# Patient Record
Sex: Male | Born: 1985 | Race: Black or African American | Hispanic: No | Marital: Married | Smoking: Never smoker
Health system: Southern US, Community
[De-identification: ages and names within clinical notes are randomized; demographics above are authoritative.]

## PROBLEM LIST (undated history)

## (undated) DIAGNOSIS — T7840XA Allergy, unspecified, initial encounter: Secondary | ICD-10-CM

## (undated) HISTORY — DX: Allergy, unspecified, initial encounter: T78.40XA

## (undated) HISTORY — PX: TONSILLECTOMY: SUR1361

---
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
## 2012-06-14 ENCOUNTER — Emergency Department (HOSPITAL_COMMUNITY): Payer: BC Managed Care – PPO

## 2012-06-14 ENCOUNTER — Emergency Department (HOSPITAL_COMMUNITY)
Admission: EM | Admit: 2012-06-14 | Discharge: 2012-06-15 | Disposition: A | Discharge status: Home / Self Care | Payer: BC Managed Care – PPO | Attending: Emergency Medicine | Admitting: Emergency Medicine

## 2012-06-14 ENCOUNTER — Encounter (HOSPITAL_COMMUNITY): Payer: Self-pay | Admitting: Emergency Medicine

## 2012-06-14 DIAGNOSIS — R000 Tachycardia, unspecified: Secondary | ICD-10-CM | POA: Insufficient documentation

## 2012-06-14 DIAGNOSIS — J029 Acute pharyngitis, unspecified: Secondary | ICD-10-CM | POA: Insufficient documentation

## 2012-06-14 DIAGNOSIS — R112 Nausea with vomiting, unspecified: Secondary | ICD-10-CM | POA: Insufficient documentation

## 2012-06-14 DIAGNOSIS — R509 Fever, unspecified: Secondary | ICD-10-CM | POA: Insufficient documentation

## 2012-06-14 DIAGNOSIS — R55 Syncope and collapse: Secondary | ICD-10-CM | POA: Insufficient documentation

## 2012-06-14 DIAGNOSIS — B9789 Other viral agents as the cause of diseases classified elsewhere: Secondary | ICD-10-CM | POA: Insufficient documentation

## 2012-06-14 DIAGNOSIS — R5381 Other malaise: Secondary | ICD-10-CM | POA: Insufficient documentation

## 2012-06-14 DIAGNOSIS — R51 Headache: Secondary | ICD-10-CM | POA: Insufficient documentation

## 2012-06-14 DIAGNOSIS — B349 Viral infection, unspecified: Secondary | ICD-10-CM

## 2012-06-14 LAB — COMPREHENSIVE METABOLIC PANEL
ALT: 25 U/L (ref 0–53)
AST: 20 U/L (ref 0–37)
Albumin: 4.1 g/dL (ref 3.5–5.2)
Alkaline Phosphatase: 57 U/L (ref 39–117)
Chloride: 101 mEq/L (ref 96–112)
Potassium: 3.5 mEq/L (ref 3.5–5.1)
Sodium: 137 mEq/L (ref 135–145)
Total Bilirubin: 1.7 mg/dL — ABNORMAL HIGH (ref 0.3–1.2)
Total Protein: 7.5 g/dL (ref 6.0–8.3)

## 2012-06-14 LAB — DIFFERENTIAL
Basophils Absolute: 0 10*3/uL (ref 0.0–0.1)
Basophils Relative: 0 % (ref 0–1)
Monocytes Relative: 5 % (ref 3–12)
Neutro Abs: 19.2 10*3/uL — ABNORMAL HIGH (ref 1.7–7.7)
Neutrophils Relative %: 89 % — ABNORMAL HIGH (ref 43–77)

## 2012-06-14 LAB — CBC
Hemoglobin: 13.4 g/dL (ref 13.0–17.0)
MCHC: 34.4 g/dL (ref 30.0–36.0)
Platelets: 228 10*3/uL (ref 150–400)
RDW: 12.8 % (ref 11.5–15.5)

## 2012-06-14 MED ORDER — KETOROLAC TROMETHAMINE 30 MG/ML IJ SOLN
30.0000 mg | Freq: Once | INTRAMUSCULAR | Status: AC
  Administered 2012-06-14: 30 mg via INTRAVENOUS
  Filled 2012-06-14: qty 1

## 2012-06-14 MED ORDER — SODIUM CHLORIDE 0.9 % IV SOLN
INTRAVENOUS | Status: DC
  Administered 2012-06-14: 23:00:00 via INTRAVENOUS

## 2012-06-14 MED ORDER — SODIUM CHLORIDE 0.9 % IV BOLUS (SEPSIS)
1000.0000 mL | Freq: Once | INTRAVENOUS | Status: AC
  Administered 2012-06-14: 1000 mL via INTRAVENOUS

## 2012-06-14 MED ORDER — ONDANSETRON HCL 4 MG/2ML IJ SOLN
4.0000 mg | Freq: Once | INTRAMUSCULAR | Status: AC
  Administered 2012-06-14: 4 mg via INTRAVENOUS
  Filled 2012-06-14: qty 2

## 2012-06-14 MED ORDER — HYDROMORPHONE HCL PF 1 MG/ML IJ SOLN
1.0000 mg | Freq: Once | INTRAMUSCULAR | Status: AC
  Administered 2012-06-14: 1 mg via INTRAVENOUS
  Filled 2012-06-14: qty 1

## 2012-06-14 NOTE — ED Notes (Signed)
Patient complaining of vomiting, chills, and headache after eating lunch today.

## 2012-06-14 NOTE — ED Notes (Signed)
Remains resting in bed on back. Pain 7\10. Nauseated. Denies needs. No distress. Family with patient. Awaiting to be seen. Call bell within reach.

## 2012-06-14 NOTE — ED Provider Notes (Signed)
History   This chart was scribed for William Jakes, MD by Shari Heritage. The patient was seen in room APA05/APA05. Patient's care was started at 1905.     CSN: [id number]  Arrival date & time 06/14/12  5621   First MD Initiated Contact with Patient 06/14/12 2108      Chief Complaint  Patient presents with  . Emesis  . Headache    (Consider location/radiation/quality/duration/timing/severity/associated sxs/prior treatment) Patient is a 26 y.o. male presenting with headaches. The history is provided by the patient and a relative. No language interpreter was used.  Headache  This is a new problem. The current episode started 6 to 12 hours ago. The problem occurs constantly. The problem has been gradually improving. The headache is associated with an unknown factor. The pain is moderate. The pain does not radiate. Associated symptoms include a fever, near-syncope, nausea and vomiting. Pertinent negatives include no shortness of breath. He has tried nothing for the symptoms.   William Branch is a 26 y.o. male who presents to the Emergency Department complaining of a constant, diffuse headache onset 8 hours ago with associated vomiting and chills. Patient said that the vomiting started 1.5 hours after the headache. Patient says his headache started to improve when he arrived in the ED. Patient has also experienced fever, chills, back pain, syncopal episodes, and sore throat. Patient hasn't taken any medications for relief of symptoms. Patient says that his daughter is sick with similar symptoms. Patient denies body aches, chest pain, dysuria, rash, SOB, abdominal pain, diarrhea, and neck pain. Patient with surgical h/o tonsillectomy. Patient has never smoked. Patient is allergic to penicillins.  PCP - Hansel Feinstein  History reviewed. No pertinent past medical history.  Past Surgical History  Procedure Date  . Tonsillectomy     History reviewed. No pertinent family history.  History    Substance Use Topics  . Smoking status: Never Smoker   . Smokeless tobacco: Not on file  . Alcohol Use: Yes     ocassionally      Review of Systems  Constitutional: Positive for fever and chills.  HENT: Positive for sore throat. Negative for congestion and neck pain.   Respiratory: Negative for shortness of breath.   Cardiovascular: Positive for near-syncope. Negative for chest pain.  Gastrointestinal: Positive for nausea and vomiting. Negative for abdominal pain.  Musculoskeletal: Negative for back pain.  Skin: Negative for rash.  Neurological: Positive for syncope and headaches.   Patient is positive for fever, chills, back pain, syncope, and sore throat. Patient is negative for congestion, body aches, chest pain, dysuria, rash, SOB, abdominal pain, diarrhea, and neck pain.  Allergies  Penicillins  Home Medications   Current Outpatient Rx  Name Route Sig Dispense Refill  . DM-GUAIFENESIN ER 30-600 MG PO TB12 Oral Take 1 tablet by mouth every 12 (twelve) hours. 14 tablet 1  . NAPROXEN 500 MG PO TABS Oral Take 1 tablet (500 mg total) by mouth 2 (two) times daily. 14 tablet 0  . ONDANSETRON 8 MG PO TBDP Oral Take 1 tablet (8 mg total) by mouth every 8 (eight) hours as needed for nausea. 10 tablet 0  . PROMETHAZINE HCL 25 MG PO TABS Oral Take 1 tablet (25 mg total) by mouth every 6 (six) hours as needed for nausea. 12 tablet 0    BP 98/59  Pulse 112  Temp 100.2 F (37.9 C) (Oral)  Resp 18  Ht 5\' 10"  (1.778 m)  Wt 225 lb (102.059 kg)  BMI 32.28 kg/m2  SpO2 99%  Physical Exam  Nursing note and vitals reviewed. Constitutional: He is oriented to person, place, and time. He appears well-developed and well-nourished.  HENT:  Head: Normocephalic and atraumatic.  Eyes: Conjunctivae and EOM are normal. Pupils are equal, round, and reactive to light.  Neck: Normal range of motion. Neck supple.  Cardiovascular: Regular rhythm.  Tachycardia present.   No murmur  heard. Pulmonary/Chest: Effort normal and breath sounds normal. No respiratory distress. He has no wheezes. He has no rales.  Abdominal: Soft. Bowel sounds are normal. There is no tenderness.  Musculoskeletal: Normal range of motion.       No swelling in legs.  Lymphadenopathy:    He has no cervical adenopathy.  Neurological: He is alert and oriented to person, place, and time.  Skin: Skin is warm and dry.  Psychiatric: He has a normal mood and affect.    ED Course  Procedures (including critical care time) DIAGNOSTIC STUDIES: Oxygen Saturation is 100% on room air, normal by my interpretation.    COORDINATION OF CARE: 9:25PM- Patient informed of current plan for treatment and evaluation and agrees with plan at this time. Will administer IV fluids, Zofran and Dilaudid. Will order chest X-ray and head CT.  Results for orders placed during the hospital encounter of 06/14/12  CBC      Component Value Range   WBC 21.5 (*) 4.0 - 10.5 K/uL   RBC 4.85  4.22 - 5.81 MIL/uL   Hemoglobin 13.4  13.0 - 17.0 g/dL   HCT 46.9  62.9 - 52.8 %   MCV 80.4  78.0 - 100.0 fL   MCH 27.6  26.0 - 34.0 pg   MCHC 34.4  30.0 - 36.0 g/dL   RDW 41.3  24.4 - 01.0 %   Platelets 228  150 - 400 K/uL  DIFFERENTIAL      Component Value Range   Neutrophils Relative 89 (*) 43 - 77 %   Neutro Abs 19.2 (*) 1.7 - 7.7 K/uL   Lymphocytes Relative 6 (*) 12 - 46 %   Lymphs Abs 1.3  0.7 - 4.0 K/uL   Monocytes Relative 5  3 - 12 %   Monocytes Absolute 1.0  0.1 - 1.0 K/uL   Eosinophils Relative 0  0 - 5 %   Eosinophils Absolute 0.0  0.0 - 0.7 K/uL   Basophils Relative 0  0 - 1 %   Basophils Absolute 0.0  0.0 - 0.1 K/uL  COMPREHENSIVE METABOLIC PANEL      Component Value Range   Sodium 137  135 - 145 mEq/L   Potassium 3.5  3.5 - 5.1 mEq/L   Chloride 101  96 - 112 mEq/L   CO2 23  19 - 32 mEq/L   Glucose, Bld 116 (*) 70 - 99 mg/dL   BUN 15  6 - 23 mg/dL   Creatinine, Ser 2.72  0.50 - 1.35 mg/dL   Calcium 53.6  8.4 -  10.5 mg/dL   Total Protein 7.5  6.0 - 8.3 g/dL   Albumin 4.1  3.5 - 5.2 g/dL   AST 20  0 - 37 U/L   ALT 25  0 - 53 U/L   Alkaline Phosphatase 57  39 - 117 U/L   Total Bilirubin 1.7 (*) 0.3 - 1.2 mg/dL   GFR calc non Af Amer 80 (*) >90 mL/min   GFR calc Af Amer >90  >90 mL/min  RAPID STREP SCREEN  Component Value Range   Streptococcus, Group A Screen (Direct) NEGATIVE  NEGATIVE   Dg Chest 1 View  06/14/2012  *RADIOLOGY REPORT*  Clinical Data: Weakness, headache and emesis.  CHEST - 1 VIEW  Comparison: None.  Findings: The lungs are well-aerated.  There is elevation of the right hemidiaphragm.  There is no evidence of focal opacification, pleural effusion or pneumothorax.  The cardiomediastinal silhouette is within normal limits.  No acute osseous abnormalities are seen.  IMPRESSION: No acute cardiopulmonary process seen.  Original Report Authenticated By: Tonia Ghent, M.D.   Ct Head Wo Contrast  06/14/2012  *RADIOLOGY REPORT*  Clinical Data: Headache, fever and emesis; near-syncope.  Chills, back pain and sore throat.  CT HEAD WITHOUT CONTRAST  Technique:  Contiguous axial images were obtained from the base of the skull through the vertex without contrast.  Comparison: None.  Findings: There is no evidence of acute infarction, mass lesion, or intra- or extra-axial hemorrhage on CT.  The posterior fossa, including the cerebellum, brainstem and fourth ventricle, is within normal limits.  The third and lateral ventricles, and basal ganglia are unremarkable in appearance.  The cerebral hemispheres are symmetric in appearance, with normal gray- white differentiation.  No mass effect or midline shift is seen.  There is no evidence of fracture; visualized osseous structures are unremarkable in appearance.  The visualized portions of the orbits are within normal limits.  The paranasal sinuses and mastoid air cells are well-aerated.  No significant soft tissue abnormalities are seen.  IMPRESSION:  Unremarkable noncontrast CT of the head.  Original Report Authenticated By: Tonia Ghent, M.D.    1. Viral syndrome       MDM   Patient was on the onset of an illness at 2:00 in the afternoon which had a severe headache at first and then got vomiting bodyaches chills headache improved upon arrival here after 1 mg of Dilaudid it went away completely nausea and vomiting under control patient got 2 L of fluid before he urinated has absolutely no headache now no next deafness did have a sore throat with this as well no cough or congestion at this point in time. Strep test was negative chest x-ray is negative for pneumonia. Patient has been up and walking is not near syncopal or syncopal doubt that this is a bacterial meningitis suspected safe onset of a viral illness that may end up being in upper respiratory illness. Patient will need close followup is able to stay with his wife he has Dr. Kennis Carina for primary care Dr. And can return here for new worse symptoms. Family members noted bring him back for any worse headache her next deafness or fevers that go up to 104 or any new rash or anything is worse.  Marked leukocytosis is noted no significant electrolyte abnormalities slight liver function test abnormalities and not consistent with hepatitis. Again the patient has no neck stiffness has no low back stiffness.     I personally performed the services described in this documentation, which was scribed in my presence. The recorded information has been reviewed and considered.     William Jakes, MD 06/15/12 [phone]

## 2012-06-14 NOTE — ED Notes (Signed)
Remains resting in bed on back. Family with patient. Equal chest rise and fall, regular, unlabored. No distress. Denies needs. Call bell within reach. Will continue to monitor.

## 2012-06-14 NOTE — ED Notes (Signed)
Into room to assess patient. States around 1400, started having headache, chills, and nausea\ vomiting\ abdominal pain. Rates all pain 7\10. Headache all over forehead, throbbing and aching-- constant. Denies visual disturbances. Pupils 4mm bilaterally and reactive. Abdominal pain in center near umbilicus. Comes and goes "straining\ aching". Active bowel sounds. No tenderness-- abdomen soft. Nothing makes any of pain better or worse. Last food\ fluid at 1200. Tolerated. Last BM yesterday and normal. Denies needs. Family with patient. Awaiting MD eval.

## 2012-06-14 NOTE — ED Notes (Signed)
MD at bedside to evaluate.

## 2012-06-14 NOTE — ED Notes (Signed)
Pain 0\10. Denies nausea. Sats 88%. Notified to take deep breaths in through nose and out through mouth. Sats increased to 95% on room air. Placed on 2L O2 [place]. To radiology via stretcher.

## 2012-06-14 NOTE — ED Notes (Signed)
Medicated as ordered. Resting comfortably. Call bell and family at bedside. Denies needs. No distress. Equal chest rise and fall, regular, unlabored. Will continue to monitor.

## 2012-06-15 MED ORDER — PROMETHAZINE HCL 25 MG PO TABS: 25.0000 mg | ORAL_TABLET | Freq: Four times a day (QID) | ORAL | Status: DC | PRN

## 2012-06-15 MED ORDER — DM-GUAIFENESIN ER 30-600 MG PO TB12: 1.0000 | ORAL_TABLET | Freq: Two times a day (BID) | ORAL | Status: AC

## 2012-06-15 MED ORDER — NAPROXEN 500 MG PO TABS: 500.0000 mg | ORAL_TABLET | Freq: Two times a day (BID) | ORAL | Status: AC

## 2012-06-15 MED ORDER — ONDANSETRON 8 MG PO TBDP: 8.0000 mg | ORAL_TABLET | Freq: Three times a day (TID) | ORAL | Status: AC | PRN

## 2012-06-15 NOTE — ED Notes (Signed)
Assumed care for discharge only.  Discharge instructions given and reviewed with patient.  Prescriptions given for Naprosyn, Mucinex DM, Phenergan and Zofran; effects and use explained.  Patient verbalized understanding to take medications as directed and to follow up with PCP as needed.  Patient ambulatory with steady gait; discharged home in good condition.

## 2012-06-15 NOTE — Discharge Instructions (Signed)
Take medications as directedrest and work note provided return for any new or worse symptoms particularly high fever neck stiffness worse headache. If you start with coughing congestion taking Mucinex DM every 12 hours. Followup with your regular Dr. In the next 2 days if not better return here for any new worse symptoms. Increase fluids take antinausea medicine as directed.

## 2013-03-15 ENCOUNTER — Ambulatory Visit (INDEPENDENT_AMBULATORY_CARE_PROVIDER_SITE_OTHER): Payer: Federal, State, Local not specified - PPO | Admitting: Family Medicine

## 2013-03-15 ENCOUNTER — Encounter: Payer: Self-pay | Admitting: Family Medicine

## 2013-03-15 VITALS — BP 110/80 | Temp 98.4°F | Ht 69.0 in | Wt 204.0 lb

## 2013-03-15 DIAGNOSIS — R062 Wheezing: Secondary | ICD-10-CM

## 2013-03-15 MED ORDER — ALBUTEROL SULFATE (5 MG/ML) 0.5% IN NEBU
2.5000 mg | INHALATION_SOLUTION | Freq: Once | RESPIRATORY_TRACT | Status: AC
  Administered 2013-03-15: 2.5 mg via RESPIRATORY_TRACT

## 2013-03-15 MED ORDER — AZITHROMYCIN 250 MG PO TABS: ORAL_TABLET | ORAL | Status: AC

## 2013-03-15 MED ORDER — ALBUTEROL SULFATE HFA 108 (90 BASE) MCG/ACT IN AERS: 2.0000 | INHALATION_SPRAY | Freq: Four times a day (QID) | RESPIRATORY_TRACT | Status: AC | PRN

## 2013-03-15 MED ORDER — PREDNISONE 20 MG PO TABS: ORAL_TABLET | ORAL | Status: DC

## 2013-03-15 NOTE — Progress Notes (Signed)
  Subjective:    Patient ID: William Branch, male    DOB: 1986/03/19, 27 y.o.   MRN: [id number]  Wheezing  This is a new problem. The current episode started yesterday. The problem occurs constantly. The problem has been gradually worsening. Associated symptoms include coughing and shortness of breath. Pertinent negatives include no abdominal pain, chest pain, chills, diarrhea, ear pain, fever, headaches, rhinorrhea, sore throat, sputum production or swollen glands. Nothing aggravates the symptoms. He has tried nothing for the symptoms.   Should be noted that the patient had a mild cold over the past several days and he thinks this may have triggered this. He denies any sinus pressure he denies any high fevers.   Review of Systems  Constitutional: Negative for fever and chills.  HENT: Negative for ear pain, sore throat and rhinorrhea.   Respiratory: Positive for cough, shortness of breath and wheezing. Negative for sputum production.   Cardiovascular: Negative for chest pain.  Gastrointestinal: Negative for abdominal pain and diarrhea.  Neurological: Negative for headaches.       Objective:   Physical Exam  Constitutional: He appears well-developed.  HENT:  Head: Normocephalic.  Neck: Normal range of motion. No thyromegaly present.  Cardiovascular: Normal rate and regular rhythm.   Pulmonary/Chest: Effort normal. He has wheezes.  Patient not in respiratory distress but has significant difficulty getting a good deep breath he does have some wheezes and deep breathing does trigger coughing  Musculoskeletal: He exhibits no edema.  Lymphadenopathy:    He has no cervical adenopathy.          Assessment & Plan:  Wheezing - Plan: albuterol (PROVENTIL) (5 MG/ML) 0.5% nebulizer solution 2.5 mg  I believe this patient also has a mild URI/sinusitis Zithromax was prescribed also prednisone 20 mg tablets 3 tablets daily for 3 days, to a day for 3 days, 1 a day for 3 days. He will use  albuterol on a regular basis over the next few days then should gradually taper off he should do fine in the long run call us if reoccurring problems or followup. No orders of the defined types were placed in this encounter.

## 2013-03-15 NOTE — Patient Instructions (Signed)
Bronchospasm A bronchospasm is when the tubes that carry air in and out of your lungs (bronchioles) become smaller. It is hard to breathe when this happens. A bronchospasm can be caused by:  Asthma.  Allergies.  Lung infection. HOME CARE   Do not  smoke. Avoid places that have secondhand smoke.  Dust your house often. Have your air ducts cleaned once or twice a year.  Find out what allergies may cause your bronchospasms.  Use your inhaler properly if you have one. Know when to use it.  Eat healthy foods and drink plenty of water.  Only take medicine as told by your doctor. GET HELP RIGHT AWAY IF:  You feel you cannot breathe or catch your breath.  You cannot stop coughing.  Your treatment is not helping you breathe better. MAKE SURE YOU:   Understand these instructions.  Will watch your condition.  Will get help right away if you are not doing well or get worse. Document Released: 10/11/2009 Document Revised: 03/07/2012 Document Reviewed: 10/11/2009 [facility] Patient Information 2013 [place], [place].  Use your medications as discussed. If he feels things are getting worse over the next 2-3 days please call us. You ought to be much improved by this weekend. Call if any problems.

## 2013-10-02 ENCOUNTER — Encounter: Payer: Self-pay | Admitting: Family Medicine

## 2014-01-09 IMAGING — CR DG CHEST 1V
1 series · 1 of 1 positions shown · non-contrast
Comparison: None.

CLINICAL DATA: Weakness, headache and emesis.

CHEST - 1 VIEW

[view not recorded]
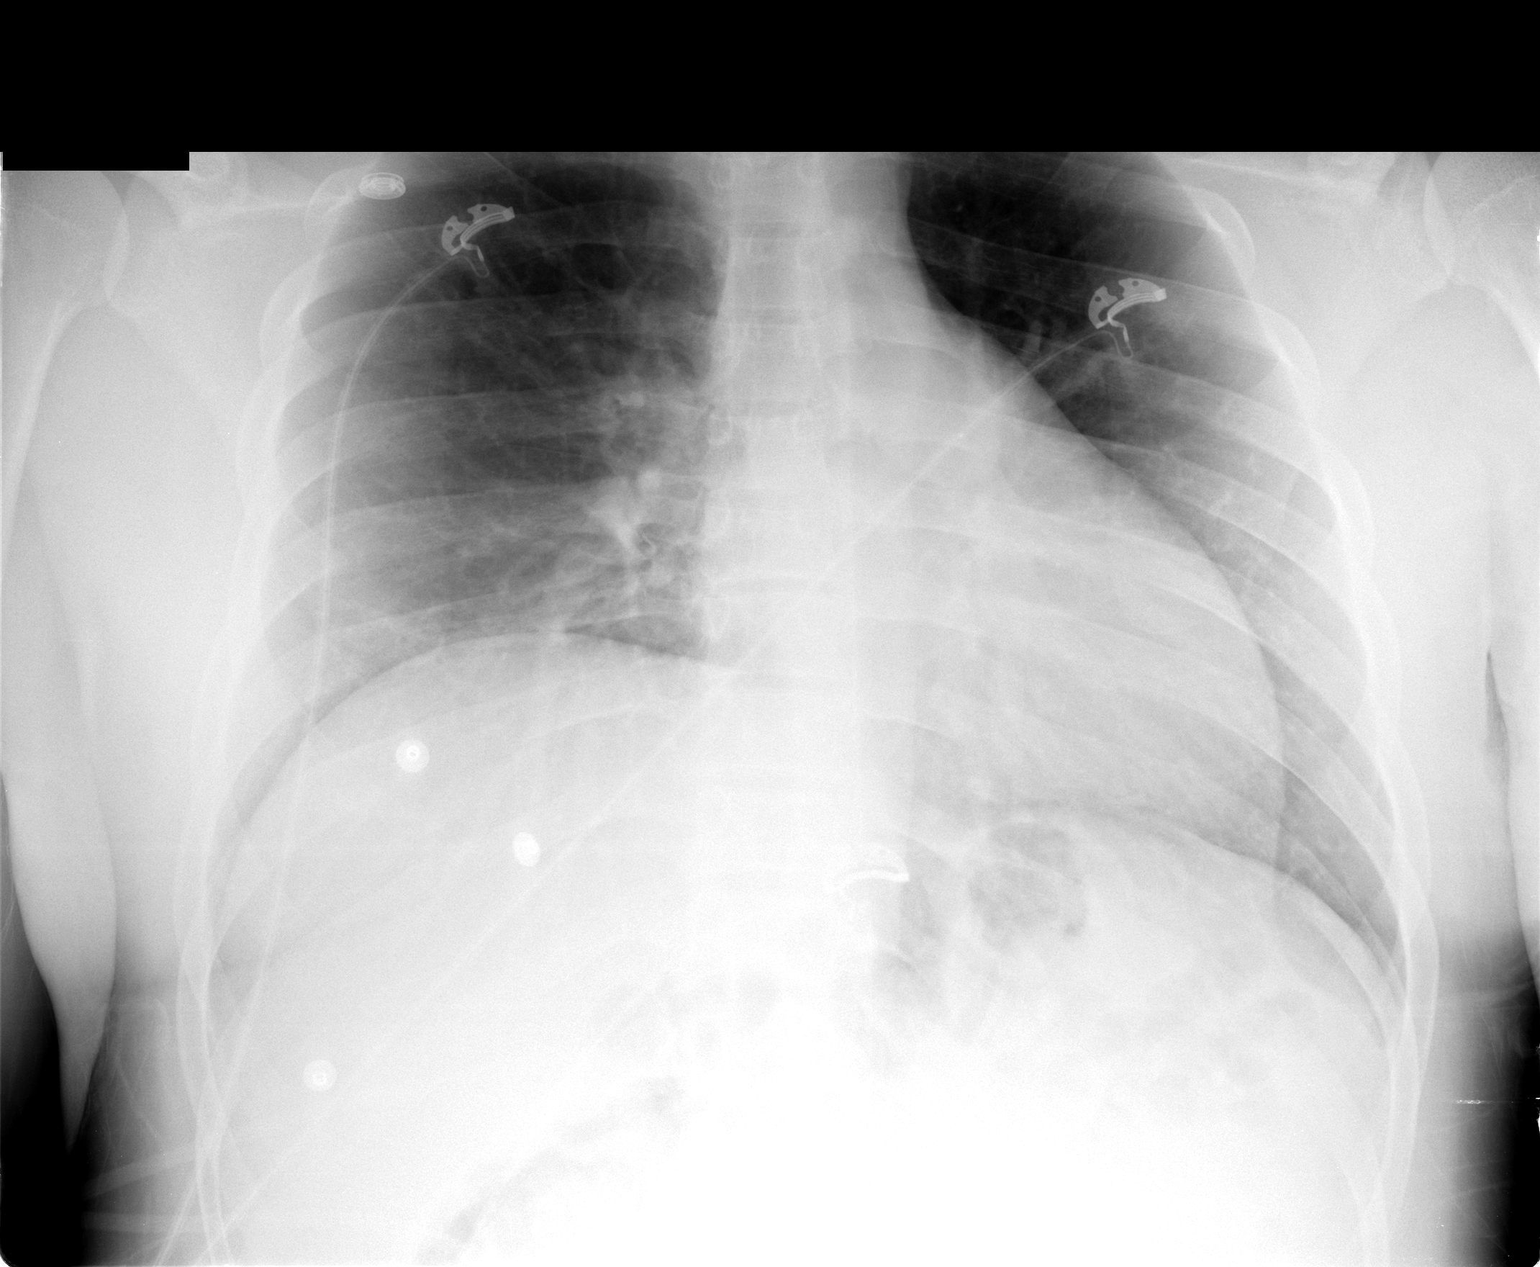

[1 of 1 positions shown; findings below may reference images not displayed]

FINDINGS: The lungs are well-aerated.  There is elevation of the
right hemidiaphragm.  There is no evidence of focal opacification,
pleural effusion or pneumothorax.

The cardiomediastinal silhouette is within normal limits.  No acute
osseous abnormalities are seen.
IMPRESSION: No acute cardiopulmonary process seen.

## 2014-01-09 IMAGING — CT CT HEAD W/O CM
1 series · 16 of 30 positions shown, 20 images · non-contrast
Comparison: None.

CLINICAL DATA: Headache, fever and emesis; near-syncope.  Chills,
back pain and sore throat.

CT HEAD WITHOUT CONTRAST
TECHNIQUE: Contiguous axial images were obtained from the base of
the skull through the vertex without contrast.

[Series 2: headseq 4.8 h37s · axial · 0.43mm/px · z∈[+1104,+1262]mm · 16 of 36 slices shown, 20 images]
[im 2/36  brain]
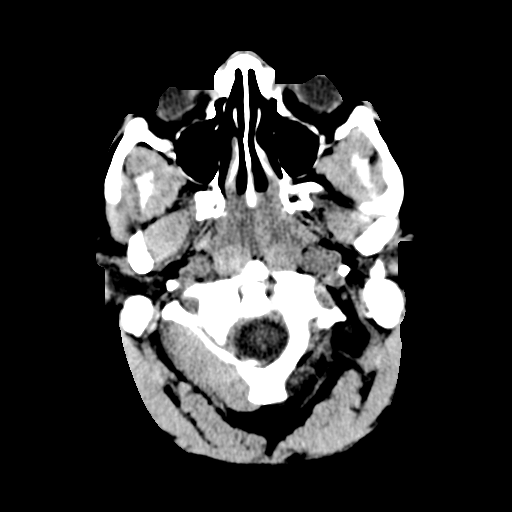
[im 2/36  bone]
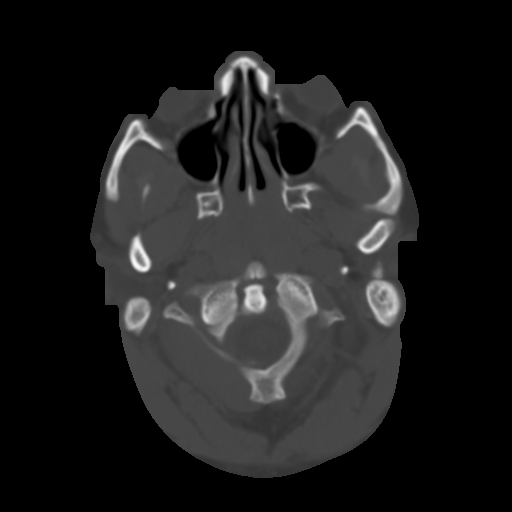
[im 4/36  brain]
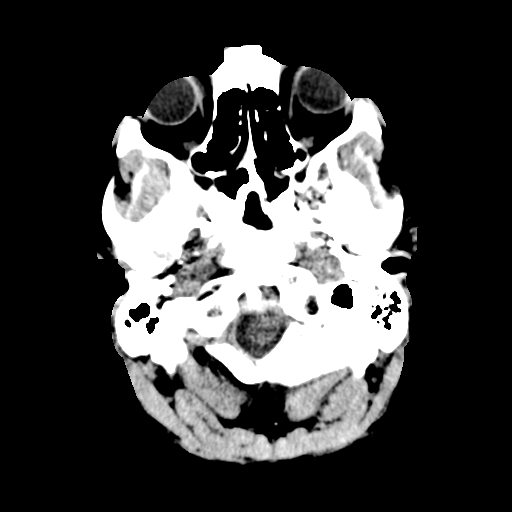
[im 7/36  brain]
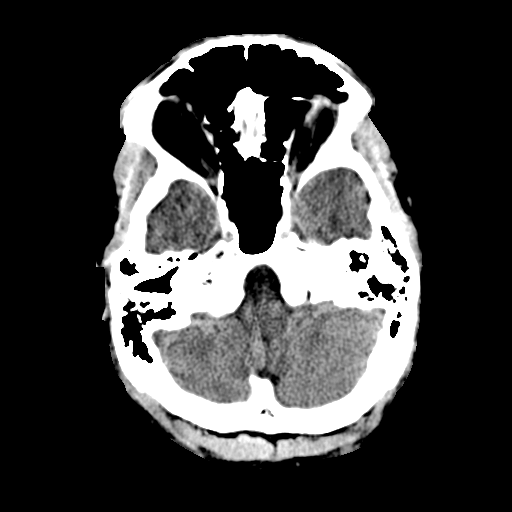
[im 9/36  brain]
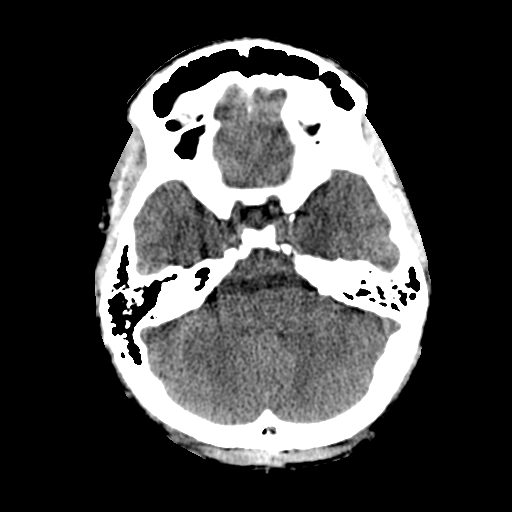
[im 10/36  brain]
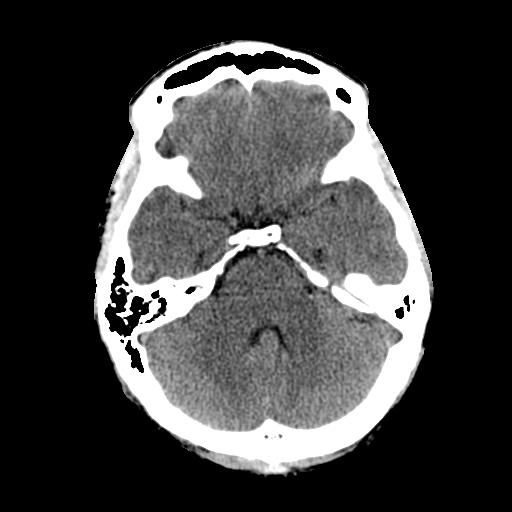
[im 10/36  bone]
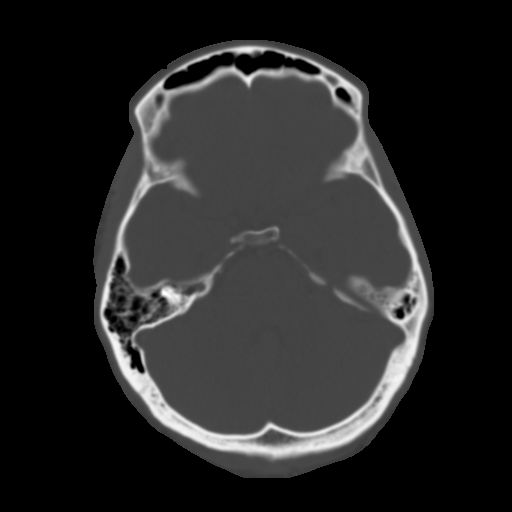
[im 13/36  brain]
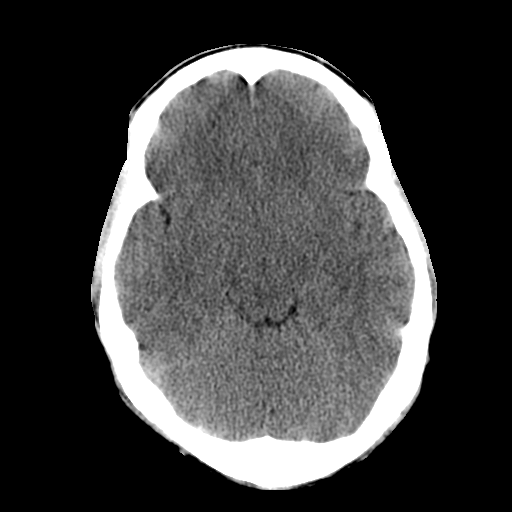
[im 15/36  brain]
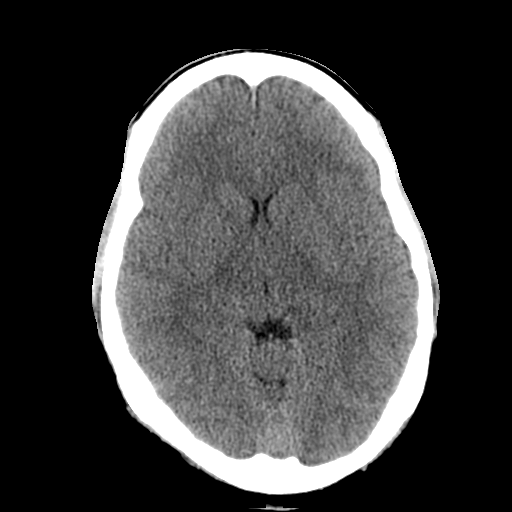
[im 17/36  brain]
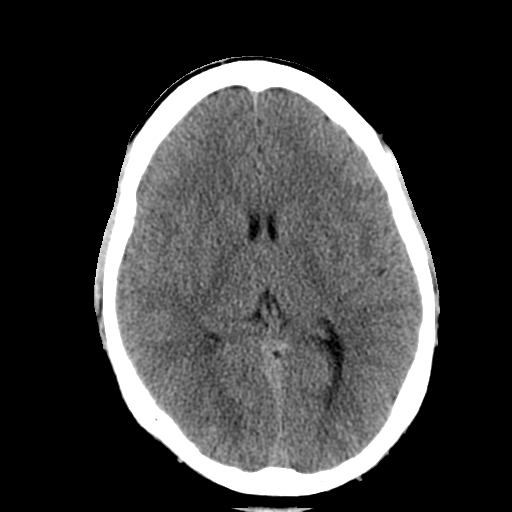
[im 19/36  brain]
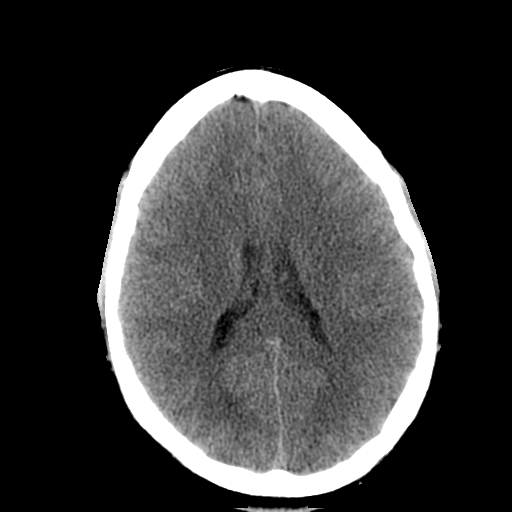
[im 19/36  bone]
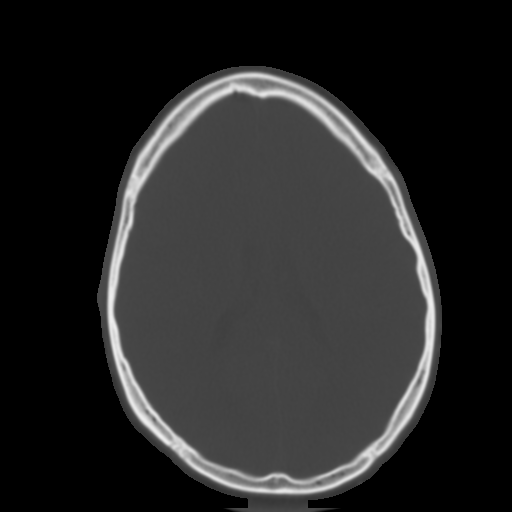
[im 21/36  brain]
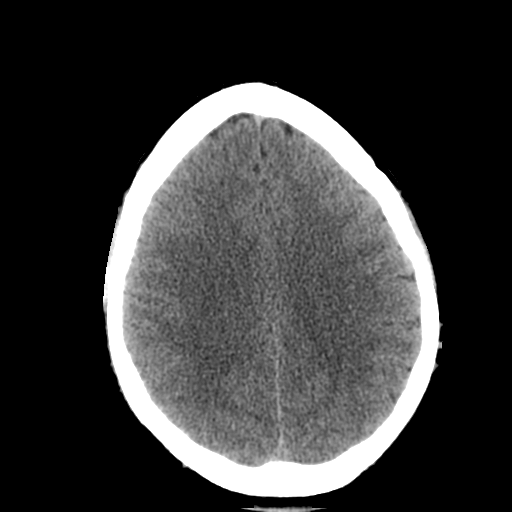
[im 23/36  brain]
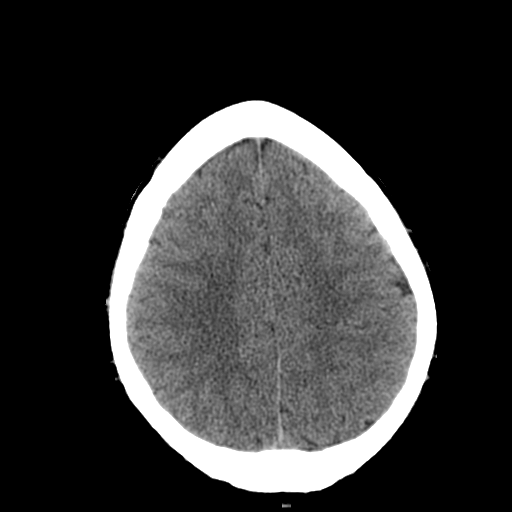
[im 26/36  brain]
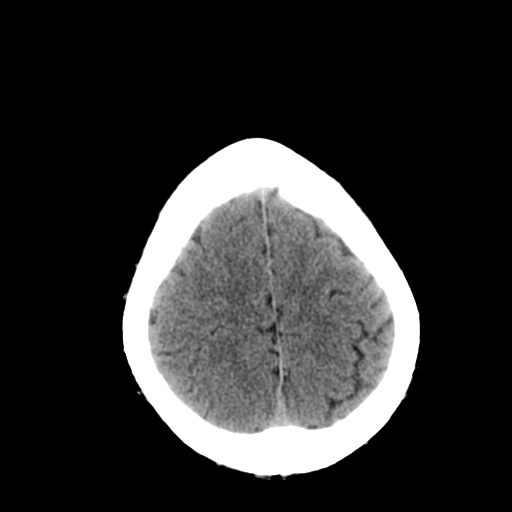
[im 27/36  brain]
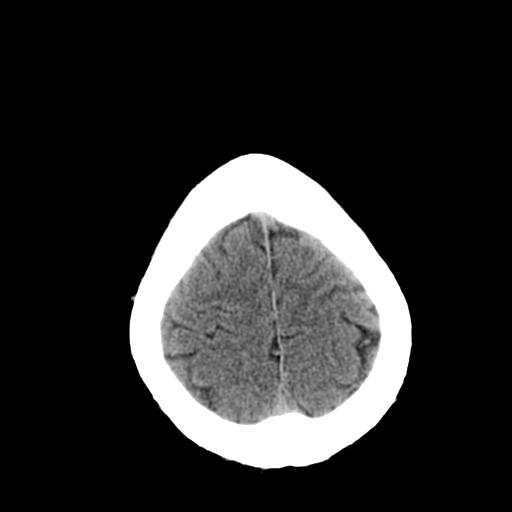
[im 27/36  bone]
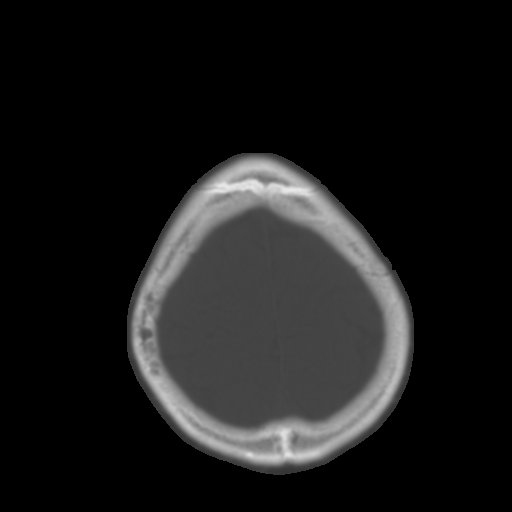
[im 29/36  brain]
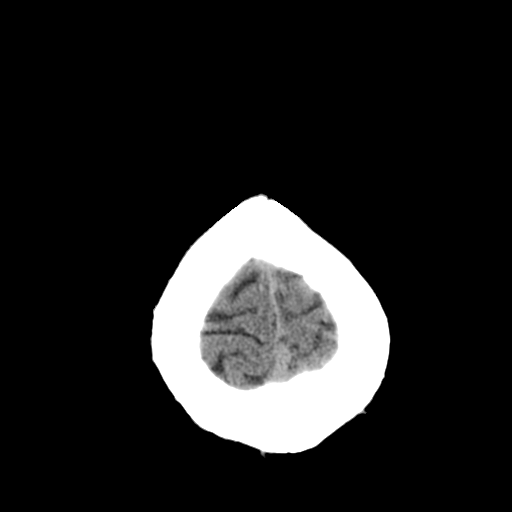
[im 32/36  brain]
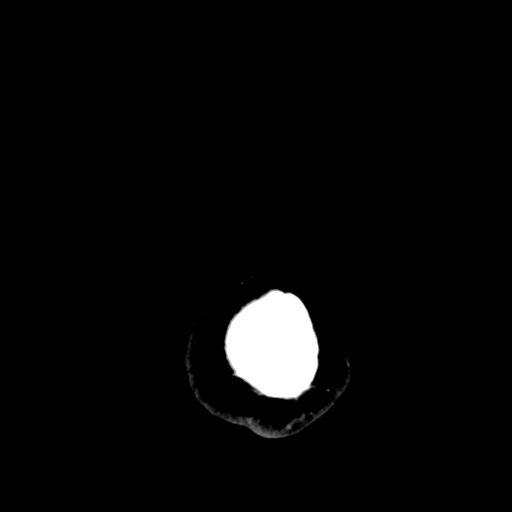
[im 34/36  brain]
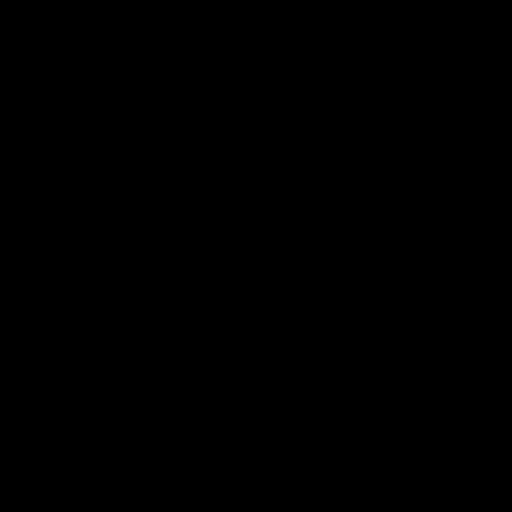

[16 of 30 positions shown; findings below may reference images not displayed]

FINDINGS: There is no evidence of acute infarction, mass lesion, or
intra- or extra-axial hemorrhage on CT.

The posterior fossa, including the cerebellum, brainstem and fourth
ventricle, is within normal limits.  The third and lateral
ventricles, and basal ganglia are unremarkable in appearance.  The
cerebral hemispheres are symmetric in appearance, with normal gray-
white differentiation.  No mass effect or midline shift is seen.

There is no evidence of fracture; visualized osseous structures are
unremarkable in appearance.  The visualized portions of the orbits
are within normal limits.  The paranasal sinuses and mastoid air
cells are well-aerated.  No significant soft tissue abnormalities
are seen.
IMPRESSION: Unremarkable noncontrast CT of the head.

## 2014-02-06 ENCOUNTER — Ambulatory Visit (INDEPENDENT_AMBULATORY_CARE_PROVIDER_SITE_OTHER): Payer: Federal, State, Local not specified - PPO | Admitting: Family Medicine

## 2014-02-06 ENCOUNTER — Encounter: Payer: Self-pay | Admitting: Family Medicine

## 2014-02-06 VITALS — BP 120/78 | Ht 68.0 in | Wt 238.2 lb

## 2014-02-06 DIAGNOSIS — Z0000 Encounter for general adult medical examination without abnormal findings: Secondary | ICD-10-CM

## 2014-02-06 NOTE — Progress Notes (Signed)
   Subjective:    Patient ID: William Branch, male    DOB: Oct 17, 1986, 28 y.o.   MRN: [id number]  HPI Patient arrives for an annual physical.  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    Breathing overall pretty good uses inhaler rarely   Patient reports no problems or concerns.   [place] the gym in [place], some cardio three days per wk  Diet not the best, eats so so . Hoping to improve this long term.  Generally does not get flu shot regularly.  Due to be married this spring in hopes to lose weight in the meantime.  No blood work within the last 10 years.    Review of Systems  Constitutional: Negative for fever, activity change and appetite change.  HENT: Negative for congestion and rhinorrhea.   Eyes: Negative for discharge.  Respiratory: Negative for cough and wheezing.   Cardiovascular: Negative for chest pain.  Gastrointestinal: Negative for vomiting, abdominal pain and blood in stool.  Genitourinary: Negative for frequency and difficulty urinating.  Musculoskeletal: Negative for neck pain.  Skin: Negative for rash.  Allergic/Immunologic: Negative for environmental allergies and food allergies.  Neurological: Negative for weakness and headaches.  Psychiatric/Behavioral: Negative for agitation.  All other systems reviewed and are negative.       Objective:   Physical Exam  Vitals reviewed. Constitutional: He appears well-developed and well-nourished.  HENT:  Head: Normocephalic and atraumatic.  Right Ear: External ear normal.  Left Ear: External ear normal.  Nose: Nose normal.  Mouth/Throat: Oropharynx is clear and moist.  Eyes: EOM are normal. Pupils are equal, round, and reactive to light.  Neck: Normal range of motion. Neck supple. No thyromegaly present.  Cardiovascular: Normal rate, regular rhythm and normal heart sounds.   No murmur heard. Pulmonary/Chest: Effort normal and breath  sounds normal. No respiratory distress. He has no wheezes.  Abdominal: Soft. Bowel sounds are normal. He exhibits no distension and no mass. There is no tenderness.  Genitourinary: Penis normal.  Musculoskeletal: Normal range of motion. He exhibits no edema.  Lymphadenopathy:    He has no cervical adenopathy.  Neurological: He is alert. He exhibits normal muscle tone.  Skin: Skin is warm and dry. No erythema.  Psychiatric: He has a normal mood and affect. His behavior is normal. Judgment normal.          Assessment & Plan:  Impression 1 wellness exam. #2 recent reactive airways results. #3 patient is overweight discussed. Challenged him to lose some weight. He's going to work on it. Plan appropriate blood work. Diet discussed. Exercise discussed. WSL

## 2014-02-12 ENCOUNTER — Telehealth: Payer: Self-pay | Admitting: Family Medicine

## 2014-02-12 NOTE — Telephone Encounter (Signed)
Pt had PE done, needs form filled out to be picked up before Wed this week if possible.  Advised pt that this may not be possible due to the weather but I would give the Doc a  Heads up on the urgency.   Call pt to pick up

## 2014-02-14 ENCOUNTER — Ambulatory Visit: Payer: Self-pay | Admitting: Family Medicine

## 2014-02-14 VITALS — BP 102/78 | HR 62 | Temp 98.0°F | Resp 16 | Ht 69.0 in | Wt 235.0 lb

## 2014-02-14 DIAGNOSIS — Z025 Encounter for examination for participation in sport: Secondary | ICD-10-CM

## 2014-02-14 DIAGNOSIS — Z0289 Encounter for other administrative examinations: Secondary | ICD-10-CM

## 2014-02-14 NOTE — Patient Instructions (Signed)
Thank you for coming in today  Please call with any concerns

## 2014-02-14 NOTE — Progress Notes (Signed)
   Subjective:    Patient ID: William PolingBrandon L Limon, male    DOB: [birth year], 28 y.o.   MRN: [id number]  HPI Patient presents for sports physical. He had his annual physical with his PCP including lab work 2 weeks ago. However, his doctor is out of town and he needs his pre-participation form completed. He has no other concerns today. No medical problems or daily meds. Allergic only to PCN.  Review of Systems  All other systems reviewed and are negative.       Objective:   Physical Exam  Constitutional: He is oriented to person, place, and time. He appears well-developed and well-nourished. No distress.  HENT:  Head: Normocephalic and atraumatic.  Right Ear: External ear normal.  Left Ear: External ear normal.  Mouth/Throat: Oropharynx is clear and moist. No oropharyngeal exudate.  Eyes: Conjunctivae are normal. Pupils are equal, round, and reactive to light. No scleral icterus.  Neck: Normal range of motion. Neck supple.  Cardiovascular: Normal rate, regular rhythm, normal heart sounds and intact distal pulses.   Pulmonary/Chest: Effort normal and breath sounds normal.  Abdominal: Soft. Bowel sounds are normal. He exhibits no distension and no mass. There is no tenderness. There is no rebound and no guarding.  Musculoskeletal: Normal range of motion.  Lymphadenopathy:    He has no cervical adenopathy.  Neurological: He is alert and oriented to person, place, and time.  Skin: Skin is warm and dry. He is not diaphoretic.  Psychiatric: He has a normal mood and affect. His behavior is normal.      Assessment & Plan:  #1. Pre-participation PE completed. Questions answered. - no red flags. Denies CP, SOB, syncope during exercise. No FH SCD or early death. - forms completed

## 2014-02-14 NOTE — Telephone Encounter (Signed)
Patient needs this paperwork filled out and turned in by 5pm today. He would like a call before 2pm this afternoon if anyway possible letting him know of the status.

## 2014-02-15 NOTE — Telephone Encounter (Signed)
Form done 

## 2017-12-17 ENCOUNTER — Other Ambulatory Visit: Payer: Self-pay | Admitting: Nurse Practitioner

## 2017-12-17 ENCOUNTER — Telehealth: Payer: Self-pay | Admitting: Family Medicine

## 2017-12-17 MED ORDER — SULFACETAMIDE SODIUM 10 % OP SOLN: 1.0000 [drp] | OPHTHALMIC | 0 refills | Status: DC

## 2017-12-17 NOTE — Telephone Encounter (Signed)
Pt is needing something called in for possible pink eye. Pt states that his eye is red and it itches and drains at night. Pt wakes up in morning with eye crusted over.   [place]

## 2017-12-17 NOTE — Telephone Encounter (Signed)
Patient notified

## 2017-12-17 NOTE — Telephone Encounter (Signed)
Sent in antibiotic eye drops. Office visit Monday if no better.

## 2018-10-10 ENCOUNTER — Ambulatory Visit: Payer: Federal, State, Local not specified - PPO | Admitting: Family Medicine

## 2018-10-10 ENCOUNTER — Encounter: Payer: Self-pay | Admitting: Family Medicine

## 2018-10-10 VITALS — Temp 97.6°F | Ht 69.0 in | Wt 213.8 lb

## 2018-10-10 DIAGNOSIS — J029 Acute pharyngitis, unspecified: Secondary | ICD-10-CM | POA: Diagnosis not present

## 2018-10-10 DIAGNOSIS — J329 Chronic sinusitis, unspecified: Secondary | ICD-10-CM | POA: Diagnosis not present

## 2018-10-10 DIAGNOSIS — J310 Chronic rhinitis: Secondary | ICD-10-CM

## 2018-10-10 LAB — POCT RAPID STREP A (OFFICE): Rapid Strep A Screen: NEGATIVE

## 2018-10-10 MED ORDER — AZITHROMYCIN 250 MG PO TABS: ORAL_TABLET | ORAL | 0 refills | Status: DC

## 2018-10-10 NOTE — Progress Notes (Signed)
   Subjective:    Patient ID: William Branch, male    DOB: [birth year], 32 y.o.   MRN: [id number]  Fever   This is a new problem. The current episode started in the past 7 days. Associated symptoms include ear pain, headaches and a sore throat. Associated symptoms comments: chills. He has tried acetaminophen (Advil, cold/flu med) for the symptoms.   Pos no exposure   Results for orders placed or performed during the hospital encounter of 06/14/12  Rapid strep screen  Result Value Ref Range   Streptococcus, Group A Screen (Direct) NEGATIVE NEGATIVE  CBC  Result Value Ref Range   WBC 21.5 (H) 4.0 - 10.5 K/uL   RBC 4.85 4.22 - 5.81 MIL/uL   Hemoglobin 13.4 13.0 - 17.0 g/dL   HCT 40.9 81.1 - 91.4 %   MCV 80.4 78.0 - 100.0 fL   MCH 27.6 26.0 - 34.0 pg   MCHC 34.4 30.0 - 36.0 g/dL   RDW 78.2 95.6 - 21.3 %   Platelets 228 150 - 400 K/uL  Differential  Result Value Ref Range   Neutrophils Relative % 89 (H) 43 - 77 %   Neutro Abs 19.2 (H) 1.7 - 7.7 K/uL   Lymphocytes Relative 6 (L) 12 - 46 %   Lymphs Abs 1.3 0.7 - 4.0 K/uL   Monocytes Relative 5 3 - 12 %   Monocytes Absolute 1.0 0.1 - 1.0 K/uL   Eosinophils Relative 0 0 - 5 %   Eosinophils Absolute 0.0 0.0 - 0.7 K/uL   Basophils Relative 0 0 - 1 %   Basophils Absolute 0.0 0.0 - 0.1 K/uL  Comprehensive metabolic panel  Result Value Ref Range   Sodium 137 135 - 145 mEq/L   Potassium 3.5 3.5 - 5.1 mEq/L   Chloride 101 96 - 112 mEq/L   CO2 23 19 - 32 mEq/L   Glucose, Bld 116 (H) 70 - 99 mg/dL   BUN 15 6 - 23 mg/dL   Creatinine, Ser 0.86 0.50 - 1.35 mg/dL   Calcium 57.8 8.4 - 46.9 mg/dL   Total Protein 7.5 6.0 - 8.3 g/dL   Albumin 4.1 3.5 - 5.2 g/dL   AST 20 0 - 37 U/L   ALT 25 0 - 53 U/L   Alkaline Phosphatase 57 39 - 117 U/L   Total Bilirubin 1.7 (H) 0.3 - 1.2 mg/dL   GFR calc non Af Amer 80 (L) >90 mL/min   GFR calc Af Amer >90 >90 mL/min     Headache mil to mof  Body aching muscle acing     No cough   Moderate  sore 400 mg of advil prn  Then not with the dayuil or nyquil   Felt ok  No tick bit e   Pos phlegma and mucus  Non smoker ;  Review of Systems  Constitutional: Positive for fever.  HENT: Positive for ear pain and sore throat.   Neurological: Positive for headaches.       Objective:   Physical Exam Alert, mild malaise. Hydration good Vitals stable. frontal/ maxillary tenderness evident positive nasal congestion. pharynx normal neck supple  lungs clear/no crackles or wheezes. heart regular in rhythm        Assessment & Plan:    Suspect mostly viral process discussed impression rhinosinusitis likely post viral, discussed with patient. plan antibiotics prescribed. Questions answered. Symptomatic care discussed. warning signs discussed. WSL

## 2018-10-11 LAB — STREP A DNA PROBE: STREP GP A DIRECT, DNA PROBE: NEGATIVE

## 2018-10-11 LAB — SPECIMEN STATUS REPORT

## 2019-09-28 ENCOUNTER — Other Ambulatory Visit: Payer: Self-pay

## 2019-09-28 ENCOUNTER — Ambulatory Visit (INDEPENDENT_AMBULATORY_CARE_PROVIDER_SITE_OTHER): Payer: Federal, State, Local not specified - PPO | Admitting: Family Medicine

## 2019-09-28 VITALS — BP 118/78 | Temp 97.7°F | Wt 207.2 lb

## 2019-09-28 DIAGNOSIS — R350 Frequency of micturition: Secondary | ICD-10-CM | POA: Diagnosis not present

## 2019-09-28 LAB — POCT URINALYSIS DIPSTICK
Spec Grav, UA: <=1.005 — AB (ref 1.010–1.025)
pH, UA: 6 (ref 5.0–8.0)

## 2019-09-28 MED ORDER — CIPROFLOXACIN HCL 500 MG PO TABS: 500.0000 mg | ORAL_TABLET | Freq: Two times a day (BID) | ORAL | 0 refills | Status: DC

## 2019-09-28 MED ORDER — PHENAZOPYRIDINE HCL 100 MG PO TABS: 100.0000 mg | ORAL_TABLET | Freq: Three times a day (TID) | ORAL | 0 refills | Status: DC | PRN

## 2019-09-28 NOTE — Patient Instructions (Signed)

## 2019-09-28 NOTE — Progress Notes (Signed)
   Subjective:    Patient ID: William Branch, male    DOB: [birth year], 33 y.o.   MRN: [id number]  HPI Pt has been having urinary frequency since this morning. Pt states he goes to the bathroom and after using the bathroom still has a full sensation. Pt states this all started this morning about 2:30. Pt did have a pain in lower left side of back earlier today.  Patient with urinary frequency.  Denies hematuria.  States this started out of the blue.  He did have a slight discomfort on his left flank earlier today but that went away.  He states he feels like he has to pee frequently.  PMH benign.  Review of Systems No dysuria no urethral discharge.  Patient with urinary frequency.  No hematuria.  No high fever chills sweats    Objective:   Physical Exam Respiratory rate is normal lungs are clear no crackles heart regular no murmurs abdomen soft no guarding or rebound flank nontender to percussion bilateral lower abdomen nontender.  Prostate is moderately tender.  Slightly swollen.   Urinalysis does show few WBCs but no RBCs    Assessment & Plan:  Urine culture pending Probable prostatitis Cipro twice daily for 3 weeks Cannot rule out the possibility of a small stone warning signs regarding kidney stone was discussed with patient should he start having severe pain discomfort in the flank with sweats nausea chills consistent with kidney stone he should contact us for further direction or go to ER.  Await the culture

## 2019-09-30 LAB — URINE CULTURE

## 2019-10-30 ENCOUNTER — Telehealth: Payer: Self-pay | Admitting: Family Medicine

## 2019-10-30 DIAGNOSIS — Z0000 Encounter for general adult medical examination without abnormal findings: Secondary | ICD-10-CM

## 2019-10-30 DIAGNOSIS — Z13220 Encounter for screening for lipoid disorders: Secondary | ICD-10-CM

## 2019-10-30 DIAGNOSIS — Z79899 Other long term (current) drug therapy: Secondary | ICD-10-CM

## 2019-10-30 NOTE — Telephone Encounter (Signed)
Only labs in system are urinalysis and strep. Please advise. Thank you

## 2019-10-30 NOTE — Telephone Encounter (Signed)
Lip liv m7 

## 2019-10-30 NOTE — Telephone Encounter (Signed)
Labs ordered and pt is aware 

## 2019-10-30 NOTE — Telephone Encounter (Signed)
patient has appointment for physical 11/16 and needing labs done

## 2019-11-08 DIAGNOSIS — Z13220 Encounter for screening for lipoid disorders: Secondary | ICD-10-CM | POA: Diagnosis not present

## 2019-11-08 DIAGNOSIS — Z0000 Encounter for general adult medical examination without abnormal findings: Secondary | ICD-10-CM | POA: Diagnosis not present

## 2019-11-08 DIAGNOSIS — Z79899 Other long term (current) drug therapy: Secondary | ICD-10-CM | POA: Diagnosis not present

## 2019-11-09 LAB — LIPID PANEL
Chol/HDL Ratio: 3.8 ratio (ref 0.0–5.0)
Cholesterol, Total: 212 mg/dL — ABNORMAL HIGH (ref 100–199)
HDL: 56 mg/dL (ref >39)
LDL Chol Calc (NIH): 146 mg/dL — ABNORMAL HIGH (ref 0–99)
Triglycerides: 54 mg/dL (ref 0–149)
VLDL Cholesterol Cal: 10 mg/dL (ref 5–40)

## 2019-11-09 LAB — BASIC METABOLIC PANEL
BUN/Creatinine Ratio: 12 (ref 9–20)
BUN: 13 mg/dL (ref 6–20)
CO2: 25 mmol/L (ref 20–29)
Calcium: 9.8 mg/dL (ref 8.7–10.2)
Chloride: 103 mmol/L (ref 96–106)
Creatinine, Ser: 1.11 mg/dL (ref 0.76–1.27)
GFR calc Af Amer: 101 mL/min/{1.73_m2} (ref >59)
GFR calc non Af Amer: 87 mL/min/{1.73_m2} (ref >59)
Glucose: 92 mg/dL (ref 65–99)
Potassium: 4.1 mmol/L (ref 3.5–5.2)
Sodium: 141 mmol/L (ref 134–144)

## 2019-11-09 LAB — HEPATIC FUNCTION PANEL
ALT: 20 IU/L (ref 0–44)
AST: 19 IU/L (ref 0–40)
Albumin: 4.6 g/dL (ref 4.0–5.0)
Alkaline Phosphatase: 53 IU/L (ref 39–117)
Bilirubin Total: 1.6 mg/dL — ABNORMAL HIGH (ref 0.0–1.2)
Bilirubin, Direct: 0.29 mg/dL (ref 0.00–0.40)
Total Protein: 7 g/dL (ref 6.0–8.5)

## 2019-11-13 ENCOUNTER — Other Ambulatory Visit: Payer: Self-pay

## 2019-11-13 ENCOUNTER — Encounter: Payer: Self-pay | Admitting: Family Medicine

## 2019-11-13 ENCOUNTER — Ambulatory Visit (INDEPENDENT_AMBULATORY_CARE_PROVIDER_SITE_OTHER): Payer: Federal, State, Local not specified - PPO | Admitting: Family Medicine

## 2019-11-13 VITALS — BP 122/82 | Temp 97.7°F | Ht 68.0 in | Wt 205.4 lb

## 2019-11-13 DIAGNOSIS — Z0000 Encounter for general adult medical examination without abnormal findings: Secondary | ICD-10-CM | POA: Diagnosis not present

## 2019-11-13 NOTE — Progress Notes (Signed)
Subjective:    Patient ID: William Branch, male    DOB: June 10, 1986, 33 y.o.   MRN: [id number]  HPI The patient comes in today for a wellness visit.    A review of their health history was completed.  A review of medications was also completed.  Any needed refills; none  Eating habits: healthy  Falls/  MVA accidents in past few months: none  Regular exercise: 3-4 days/week   Specialist pt sees on regular basis: none  Preventative health issues were discussed.   Additional concerns:   Results for orders placed or performed in visit on 10/30/19  Lipid Profile  Result Value Ref Range   Cholesterol, Total 212 (H) 100 - 199 mg/dL   Triglycerides 54 0 - 149 mg/dL   HDL 56 >96 mg/dL   VLDL Cholesterol Cal 10 5 - 40 mg/dL   LDL Chol Calc (NIH) 222 (H) 0 - 99 mg/dL   Chol/HDL Ratio 3.8 0.0 - 5.0 ratio  Hepatic function panel  Result Value Ref Range   Total Protein 7.0 6.0 - 8.5 g/dL   Albumin 4.6 4.0 - 5.0 g/dL   Bilirubin Total 1.6 (H) 0.0 - 1.2 mg/dL   Bilirubin, Direct 9.79 0.00 - 0.40 mg/dL   Alkaline Phosphatase 53 39 - 117 IU/L   AST 19 0 - 40 IU/L   ALT 20 0 - 44 IU/L  Basic Metabolic Panel (BMET)  Result Value Ref Range   Glucose 92 65 - 99 mg/dL   BUN 13 6 - 20 mg/dL   Creatinine, Ser 8.92 0.76 - 1.27 mg/dL   GFR calc non Af Amer 87 >59 mL/min/1.73   GFR calc Af Amer 101 >59 mL/min/1.73   BUN/Creatinine Ratio 12 9 - 20   Sodium 141 134 - 144 mmol/L   Potassium 4.1 3.5 - 5.2 mmol/L   Chloride 103 96 - 106 mmol/L   CO2 25 20 - 29 mmol/L   Calcium 9.8 8.7 - 10.2 mg/dL  pt eating  Fast foods more these days  More fried foods   Has picked up   Exercising some with th peloton Review of Systems  Constitutional: Negative for activity change, appetite change and fever.  HENT: Negative for congestion and rhinorrhea.   Eyes: Negative for discharge.  Respiratory: Negative for cough and wheezing.   Cardiovascular: Negative for chest pain.  Gastrointestinal:  Negative for abdominal pain, blood in stool and vomiting.  Genitourinary: Negative for difficulty urinating and frequency.  Musculoskeletal: Negative for neck pain.  Skin: Negative for rash.  Allergic/Immunologic: Negative for environmental allergies and food allergies.  Neurological: Negative for weakness and headaches.  Psychiatric/Behavioral: Negative for agitation.  All other systems reviewed and are negative.      Objective:   Physical Exam Vitals signs reviewed.  Constitutional:      Appearance: He is well-developed.  HENT:     Head: Normocephalic and atraumatic.     Right Ear: External ear normal.     Left Ear: External ear normal.     Nose: Nose normal.  Eyes:     Pupils: Pupils are equal, round, and reactive to light.  Neck:     Musculoskeletal: Normal range of motion and neck supple.     Thyroid: No thyromegaly.  Cardiovascular:     Rate and Rhythm: Normal rate and regular rhythm.     Heart sounds: Normal heart sounds. No murmur.  Pulmonary:     Effort: Pulmonary effort is normal. No respiratory  distress.     Breath sounds: Normal breath sounds. No wheezing.  Abdominal:     General: Bowel sounds are normal. There is no distension.     Palpations: Abdomen is soft. There is no mass.     Tenderness: There is no abdominal tenderness.  Genitourinary:    Penis: Normal.   Musculoskeletal: Normal range of motion.  Lymphadenopathy:     Cervical: No cervical adenopathy.  Skin:    General: Skin is warm and dry.     Findings: No erythema.  Neurological:     Mental Status: He is alert.     Motor: No abnormal muscle tone.  Psychiatric:        Behavior: Behavior normal.        Judgment: Judgment normal.           Assessment & Plan:  Impression well adult exam.  Diet discussed.  Exercise discussed.  Vaccines discussed.  Declines flu shot.  Blood work discussed.  LDL somewhat elevated encouraged to cut down fats and fast foods in his diet

## 2020-01-29 ENCOUNTER — Encounter: Payer: Self-pay | Admitting: Family Medicine

## 2020-03-08 DIAGNOSIS — Z23 Encounter for immunization: Secondary | ICD-10-CM | POA: Diagnosis not present

## 2020-04-11 DIAGNOSIS — Z23 Encounter for immunization: Secondary | ICD-10-CM | POA: Diagnosis not present

## 2020-10-11 ENCOUNTER — Ambulatory Visit (INDEPENDENT_AMBULATORY_CARE_PROVIDER_SITE_OTHER): Payer: Federal, State, Local not specified - PPO | Admitting: Family Medicine

## 2020-10-11 ENCOUNTER — Encounter: Payer: Self-pay | Admitting: Family Medicine

## 2020-10-11 VITALS — BP 108/72 | HR 99 | Temp 97.4°F | Wt 210.4 lb

## 2020-10-11 DIAGNOSIS — N50812 Left testicular pain: Secondary | ICD-10-CM | POA: Diagnosis not present

## 2020-10-11 DIAGNOSIS — N5089 Other specified disorders of the male genital organs: Secondary | ICD-10-CM | POA: Diagnosis not present

## 2020-10-11 DIAGNOSIS — Z113 Encounter for screening for infections with a predominantly sexual mode of transmission: Secondary | ICD-10-CM | POA: Diagnosis not present

## 2020-10-11 LAB — POCT URINALYSIS DIPSTICK (MANUAL)
Leukocytes, UA: NEGATIVE
Nitrite, UA: NEGATIVE
Poct Bilirubin: NEGATIVE
Poct Blood: NEGATIVE
Poct Glucose: NORMAL mg/dL
Poct Ketones: NEGATIVE
Poct Protein: NEGATIVE mg/dL
Poct Urobilinogen: NORMAL mg/dL
Spec Grav, UA: 1.01 (ref 1.010–1.025)
pH, UA: 5 (ref 5.0–8.0)

## 2020-10-11 NOTE — Progress Notes (Signed)
Patient ID: William Branch, male    DOB: 08/28/1986, 34 y.o.   MRN: [id number]   Chief Complaint  Patient presents with  . knot on testicle    Patient reports having a knot on his testicle for several weeks now that aches at times.    Subjective:  CC: knot on testicle  William Branch presents today with a 2-week nodule located on his left testicle.  He reports that it aches.  Denies swelling denies any changes in his sexual habits denies any signs of hernia or any previous hernia surgeries with mesh.  The pain is not constant but it does increased with urination.  He rates this pain 3/10.  Pertinent negatives include no fever, no urinary symptoms, no swelling, redness, or tenderness to touch..  This does not seem to be infectious.    Medical History William Branch has a past medical history of Allergy.   Outpatient Encounter Medications as of 10/11/2020  Medication Sig  . albuterol (PROVENTIL HFA;VENTOLIN HFA) 108 (90 BASE) MCG/ACT inhaler Inhale 2 puffs into the lungs every 6 (six) hours as needed for wheezing.  . [DISCONTINUED] ciprofloxacin (CIPRO) 500 MG tablet Take 1 tablet (500 mg total) by mouth 2 (two) times daily. (Patient not taking: Reported on 11/13/2019)  . [DISCONTINUED] phenazopyridine (PYRIDIUM) 100 MG tablet Take 1 tablet (100 mg total) by mouth 3 (three) times daily as needed for pain. (Patient not taking: Reported on 11/13/2019)   No facility-administered encounter medications on file as of 10/11/2020.     Review of Systems  Constitutional: Negative for chills, fever and unexpected weight change.  Respiratory: Negative for shortness of breath.   Gastrointestinal: Negative for abdominal pain.  Genitourinary: Positive for testicular pain. Negative for difficulty urinating, discharge, dysuria, flank pain, frequency, genital sores, hematuria, penile pain, penile swelling and scrotal swelling.       Described as ache 3/10. X 2 weeks     Vitals BP 108/72   Pulse 99   Temp  (!) 97.4 F (36.3 C)   Wt 210 lb 6.4 oz (95.4 kg)   SpO2 100%   BMI 31.99 kg/m   Objective:   Physical Exam Vitals and nursing note reviewed. Exam conducted with a chaperone present.  Constitutional:      Appearance: Normal appearance.  Cardiovascular:     Rate and Rhythm: Normal rate and regular rhythm.     Heart sounds: Normal heart sounds.  Pulmonary:     Effort: Pulmonary effort is normal.     Breath sounds: Normal breath sounds.  Genitourinary:    Penis: Normal.      Comments: Left testicle with 2-3 mm firm area on under side of testicle. Present for 2 weeks. No erythema, warmth, no tenderness to touch. Does not appear to be infectious process. No changes in ability to have sexual intercourse.  Neurological:     Mental Status: He is alert.      Assessment and Plan   1. Testicular pain, left - POCT Urinalysis Dip Manual - Chlamydia/Gonococcus/Trichomonas, NAA - US Scrotum  2. Testicular nodule - US Scrotum   Will check routine urine, and for sexually transmitted infections, just to be sure. We will order a scrotal ultrasound to rule out testicular cancer and other pathology.   Agrees with plan of care discussed today. Understands warning signs to seek further care: Fever, testicular swelling, pain that increases, any concerns. Understands to follow-up in 3 weeks, we will notify of results once available.  Depending upon results  of ultrasound, will consider urology consult.  Dorena Bodo, FNP-C

## 2020-10-11 NOTE — Patient Instructions (Signed)
You will hear from ultrasound at [facility] soon to let you know when to arrive.

## 2020-10-13 ENCOUNTER — Encounter: Payer: Self-pay | Admitting: Family Medicine

## 2020-10-13 DIAGNOSIS — N5089 Other specified disorders of the male genital organs: Secondary | ICD-10-CM | POA: Insufficient documentation

## 2020-10-15 ENCOUNTER — Other Ambulatory Visit: Payer: Self-pay | Admitting: *Deleted

## 2020-10-15 DIAGNOSIS — N5089 Other specified disorders of the male genital organs: Secondary | ICD-10-CM

## 2020-10-15 DIAGNOSIS — N50812 Left testicular pain: Secondary | ICD-10-CM

## 2020-10-15 LAB — CHLAMYDIA/GONOCOCCUS/TRICHOMONAS, NAA
Chlamydia by NAA: NEGATIVE
Gonococcus by NAA: NEGATIVE
Trich vag by NAA: NEGATIVE

## 2020-10-16 ENCOUNTER — Telehealth: Payer: Self-pay | Admitting: Family Medicine

## 2020-10-21 ENCOUNTER — Other Ambulatory Visit: Payer: Self-pay | Admitting: *Deleted

## 2020-10-21 ENCOUNTER — Ambulatory Visit (HOSPITAL_COMMUNITY)
Admission: RE | Admit: 2020-10-21 | Discharge: 2020-10-21 | Disposition: A | Discharge status: Home / Self Care | Payer: Federal, State, Local not specified - PPO | Source: Ambulatory Visit | Attending: Family Medicine | Admitting: Family Medicine

## 2020-10-21 ENCOUNTER — Other Ambulatory Visit: Payer: Self-pay

## 2020-10-21 DIAGNOSIS — N50812 Left testicular pain: Secondary | ICD-10-CM | POA: Diagnosis not present

## 2020-10-21 DIAGNOSIS — N5089 Other specified disorders of the male genital organs: Secondary | ICD-10-CM | POA: Diagnosis not present

## 2020-10-21 DIAGNOSIS — N50819 Testicular pain, unspecified: Secondary | ICD-10-CM | POA: Diagnosis not present

## 2020-10-21 NOTE — Progress Notes (Unsigned)
Scrotum

## 2020-11-01 ENCOUNTER — Ambulatory Visit: Payer: Federal, State, Local not specified - PPO | Admitting: Family Medicine

## 2021-02-25 ENCOUNTER — Other Ambulatory Visit: Payer: Self-pay

## 2021-02-25 ENCOUNTER — Encounter: Payer: Self-pay | Admitting: Family Medicine

## 2021-02-25 ENCOUNTER — Ambulatory Visit: Payer: Federal, State, Local not specified - PPO | Admitting: Family Medicine

## 2021-02-25 VITALS — BP 126/78 | HR 83 | Temp 97.9°F | Ht 68.5 in | Wt 215.4 lb

## 2021-02-25 DIAGNOSIS — R21 Rash and other nonspecific skin eruption: Secondary | ICD-10-CM

## 2021-02-25 DIAGNOSIS — Z91013 Allergy to seafood: Secondary | ICD-10-CM | POA: Diagnosis not present

## 2021-02-25 DIAGNOSIS — T7840XA Allergy, unspecified, initial encounter: Secondary | ICD-10-CM | POA: Insufficient documentation

## 2021-02-25 MED ORDER — PREDNISONE 20 MG PO TABS: ORAL_TABLET | ORAL | 0 refills | Status: DC

## 2021-02-25 MED ORDER — EPINEPHRINE 0.3 MG/0.3ML IJ SOAJ: 0.3000 mg | INTRAMUSCULAR | 2 refills | Status: AC | PRN

## 2021-02-25 MED ORDER — FAMOTIDINE 20 MG PO TABS: 20.0000 mg | ORAL_TABLET | Freq: Two times a day (BID) | ORAL | 0 refills | Status: AC

## 2021-02-25 MED ORDER — METHYLPREDNISOLONE ACETATE 80 MG/ML IJ SUSP
80.0000 mg | Freq: Once | INTRAMUSCULAR | Status: DC
Start: 2021-02-25 — End: 2021-02-25

## 2021-02-25 MED ORDER — METHYLPREDNISOLONE ACETATE 40 MG/ML IJ SUSP
60.0000 mg | Freq: Once | INTRAMUSCULAR | Status: AC
  Administered 2021-02-25: 60 mg via INTRAMUSCULAR

## 2021-02-25 NOTE — Patient Instructions (Signed)
Continue taking benadryl every 4-6 hours as instructed on package. Start Pepcid twice per day for 14 days. Start prednisone taper tomorrow for 9 more days. Allergist referral sent today. Get epi pen today and carry it with you every where you go.      Anaphylactic Reaction, Adult An anaphylactic reaction (anaphylaxis) is a sudden, serious allergic reaction. This affects more than one part of your body. It can be life-threatening. If you have an anaphylactic reaction, you need to get medical help right away. What are the causes? This condition is caused by exposure to things that give you an allergic reaction (allergens). Common allergens include:  Foods, such as peanuts, wheat, shellfish, milk, and eggs.  Medicines.  Insect bites or stings.  Blood or parts of blood received for treatment (transfusions).  Chemicals, such as latex and dyes that are used in food and in medical tests. What are the signs or symptoms? Signs of an anaphylactic reaction may include:  Feeling warm in the face (flushed). Your face may turn red.  Itchy, red, swollen areas of skin (hives).  Swelling of the: ? Eyes. ? Lips. ? Face. ? Mouth. ? Tongue. ? Throat.  Trouble with any of these: ? Breathing. ? Talking. ? Swallowing.  Loud breathing (wheezing).  Feeling dizzy or light-headed.  Passing out (fainting).  Pain or cramps in your belly.  Throwing up (vomiting).  Watery poop (diarrhea). How is this diagnosed? This condition is diagnosed based on:  Your symptoms.  A physical exam.  Blood tests.  Recent exposure to things that give you an allergic reaction. How is this treated? If you think you are having an anaphylactic reaction, you should do this right away:  Give yourself a shot of medicine (epinephrine) using an auto-injector "pen." Your doctor will teach you how to use this pen.  Call for emergency help. If you use a pen, you must still get treated in the hospital. There,  you may be given: ? Medicines. ? Oxygen. ? Fluids in an IV tube. Follow these instructions at home: Safety  Always keep an auto-injector pen with you. This could save your life. Use it as told by your doctor.  Do not drive after a reaction. Wait until your doctor says it is safe to drive.  Make sure that you, the people who live with you, and your employer know: ? What you are allergic to, so you can stay away from it. ? How to use your auto-injector pen.  Wear a bracelet or necklace that says you have an allergy, if your doctor tells you to do this.  Learn the signs of a very bad allergic reaction. This way, you can treat it right away.  Work with your doctors to make a plan for what to do if you have a very bad reaction. It is important to be ready. If you use your auto-injector pen:  Get more medicine (epinephrine) for your pen right away. This is important in case you have another reaction.  Get help right away.   To avoid a serious allergic reaction:  Avoid things that gave you a very bad allergic reaction before.  Tell your server about your allergy when you go out to eat. If you are not sure if your meal has food that you are allergic to, ask your server before you eat it. General instructions  Take over-the-counter and prescription medicines only as told by your doctor.  If you have itchy, red, swollen areas of skin or  a rash: ? Use an over-the-counter medicine (antihistamine) as told by your doctor. ? Put cold, wet cloths on your skin. ? Take a cool bath or shower. Avoid hot water.  Tell all doctors who care for you that you have an allergy.  Keep all follow-up visits as told by your doctor. This is important. Get help right away if:  You have signs of an allergic reaction. You may notice them soon after being exposed to things that give you an allergic reaction. Signs may include: ? Warmth in your face. Your face may turn red. ? Itchy, red, swollen areas of  skin. ? Swelling of your:  Eyes.  Lips.  Face.  Mouth.  Tongue.  Throat. ? Trouble with any of these:  Breathing.  Talking.  Swallowing. ? Loud breathing (wheezing). ? Feeling dizzy or light-headed. ? Passing out. ? Pain or cramps in your belly. ? Throwing up. ? Watery poop.  You had to use your auto-injector pen. You must go to the emergency room even if the medicine seems to be working. This is because another allergic reaction may happen within 3 days (rebound anaphylaxis). These symptoms may be an emergency. Do not wait to see if the symptoms will go away. Do this right away:  Use your auto-injector pen as you have been told.  Get medical help. Call your local emergency services (911 in the [place] Do not drive yourself to the hospital. Summary  An anaphylactic reaction (anaphylaxis) is a sudden, serious allergic reaction.  This condition can be life-threatening. If you have a reaction, get medical help right away.  Your doctor will show you how to give yourself a shot (epinephrine injection) with an auto-injector "pen."  Always keep an auto-injector pen with you. It could save your life. Use it as told by your doctor.  If you had to use your auto-injector pen, you must go to the emergency room. Go there even if the medicine seems to be working. This information is not intended to replace advice given to you by your health care provider. Make sure you discuss any questions you have with your health care provider. Document Revised: 05/17/2019 Document Reviewed: 04/07/2018 Elsevier Patient Education  2021 [employer].

## 2021-02-25 NOTE — Progress Notes (Signed)
Patient ID: William Branch, male    DOB: [birth year], 35 y.o.   MRN: [id number]   Chief Complaint  Patient presents with  . hives and itching off and on since eating Fish Sunday    Patient states he had shrimp, mussles and catfish   Subjective:  CC: shellfish allergy  This is a new problem.  Presents today with a complaint of allergy from eating shellfish.  Reports that on Saturday evening he ate shellfish and within 1 hour the back of his neck started itching.  The next Sunday morning he ate the shellfish again, presents today with hives on his face, and  upper extremities with swelling on his ears,, face mouth, neck, lips, and eyes.  Denies any respiratory involvement, no issues swallowing.  Reports intense itching, swelling, hives.  Has never had a reaction similar to this with eating shellfish.  Has tried Benadryl for the itching which has helped some.  Denies issues with swallowing, no airway involvement.    Medical History William Branch has a past medical history of Allergy.   Outpatient Encounter Medications as of 02/25/2021  Medication Sig  . EPINEPHrine 0.3 mg/0.3 mL IJ SOAJ injection Inject 0.3 mg into the muscle as needed for anaphylaxis.  . famotidine (PEPCID) 20 MG tablet Take 1 tablet (20 mg total) by mouth 2 (two) times daily.  . predniSONE (DELTASONE) 20 MG tablet Take 3 tablets by mouth for 3 days, then 2 tablets by mouth for 3 days, then 1 tablet by mouth for 3 days.  Marland Kitchen albuterol (PROVENTIL HFA;VENTOLIN HFA) 108 (90 BASE) MCG/ACT inhaler Inhale 2 puffs into the lungs every 6 (six) hours as needed for wheezing.  . [EXPIRED] methylPREDNISolone acetate (DEPO-MEDROL) injection 60 mg   . [DISCONTINUED] methylPREDNISolone acetate (DEPO-MEDROL) injection 80 mg    No facility-administered encounter medications on file as of 02/25/2021.     Review of Systems  Constitutional: Negative for chills and fever.  HENT: Negative for trouble swallowing.   Respiratory: Negative for chest  tightness and shortness of breath.   Cardiovascular: Negative for chest pain.  Skin:       Hives, swelling: eyes, mouth, nose, ears, neck, upper arms, hands.      Vitals BP 126/78   Pulse 83   Temp 97.9 F (36.6 C)   Ht 5' 8.5" (1.74 m)   Wt 215 lb 6.4 oz (97.7 kg)   SpO2 99%   BMI 32.28 kg/m   Objective:   Physical Exam Vitals reviewed.  Cardiovascular:     Rate and Rhythm: Normal rate and regular rhythm.     Heart sounds: Normal heart sounds.  Pulmonary:     Effort: Pulmonary effort is normal.     Breath sounds: Normal breath sounds.  Skin:    General: Skin is warm and dry.  Neurological:     General: No focal deficit present.     Mental Status: He is alert.  Psychiatric:        Behavior: Behavior normal.      Assessment and Plan   1. Shellfish allergy - Ambulatory referral to Allergy - EPINEPHrine 0.3 mg/0.3 mL IJ SOAJ injection; Inject 0.3 mg into the muscle as needed for anaphylaxis.  Dispense: 1 each; Refill: 2 - famotidine (PEPCID) 20 MG tablet; Take 1 tablet (20 mg total) by mouth 2 (two) times daily.  Dispense: 28 tablet; Refill: 0 - predniSONE (DELTASONE) 20 MG tablet; Take 3 tablets by mouth for 3 days, then 2 tablets by mouth  for 3 days, then 1 tablet by mouth for 3 days.  Dispense: 18 tablet; Refill: 0 - methylPREDNISolone acetate (DEPO-MEDROL) injection 60 mg  2. IgE-mediated allergic disorder   IgE mediated allergic reaction to shellfish and or catfish.  Will give methylprednisone IM in the office today, followed by prednisone taper orally.  He will continue to take Benadryl every 4-6 hours as instructed on package, he will start famotidine twice per day for the next 2 weeks.  EpiPen ordered and instructed for him to understand how this works before leaving pharmacy today, also instructed that if he ever needed to use the EpiPen, he must then report immediately to the emergency department or call 911.  Information given on anaphylaxis.  Referral sent  to allergy specialist.  Agrees with plan of care discussed today. Understands warning signs to seek further care: chest pain, shortness of breath, any significant change in health.  Understands to follow-up if symptoms worsen, do not improve, warnings discussed concerning emergency room care, use of EpiPen, and avoiding allergens.  Shellfish added to allergies on EMR.  Referral sent for allergy specialist.  William Bodo, NP 02/25/21

## 2021-04-02 ENCOUNTER — Encounter: Payer: Self-pay | Admitting: Allergy & Immunology

## 2021-04-02 ENCOUNTER — Ambulatory Visit (INDEPENDENT_AMBULATORY_CARE_PROVIDER_SITE_OTHER): Payer: Federal, State, Local not specified - PPO | Admitting: Allergy & Immunology

## 2021-04-02 ENCOUNTER — Other Ambulatory Visit: Payer: Self-pay

## 2021-04-02 VITALS — BP 108/66 | HR 71 | Temp 97.9°F | Resp 18 | Ht 69.0 in | Wt 216.0 lb

## 2021-04-02 DIAGNOSIS — L508 Other urticaria: Secondary | ICD-10-CM | POA: Diagnosis not present

## 2021-04-02 DIAGNOSIS — T7840XD Allergy, unspecified, subsequent encounter: Secondary | ICD-10-CM

## 2021-04-02 NOTE — Patient Instructions (Addendum)
1. Acute urticaria - Testing was negative to the shellfish and fin fish. - There is a the low positive predictive value of food allergy testing and hence the high possibility of false positives. - In contrast, food allergy testing has a high negative predictive value, therefore if testing is negative we can be relatively assured that they are indeed negative.  - Given the prolonged course and the long time between the emergence of the hives and the ingestion of the food, I think this is unlikely to be an allergy. - Try these foods again in the middle of the day with your kiddo's EpiPen handy.  - Come back if it happens again.  2. Return if symptoms worsen or fail to improve.    Please inform us of any Emergency Department visits, hospitalizations, or changes in symptoms. Call us before going to the ED for breathing or allergy symptoms since we might be able to fit you in for a sick visit. Feel free to contact us anytime with any questions, problems, or concerns.  It was a pleasure to see you again today!  Websites that have reliable patient information: 1. American Academy of Asthma, Allergy, and Immunology: www.aaaai.org 2. Food Allergy Research and Education (FARE): foodallergy.org 3. Mothers of Asthmatics: http://www.asthmacommunitynetwork.org 4. American College of Allergy, Asthma, and Immunology: www.acaai.org   COVID-19 Vaccine Information can be found at: [email or web address].[email or web address] For questions related to vaccine distribution or appointments, please email [email or web address]@ [email or web address] or call [phone].   We realize that you might be concerned about having an allergic reaction to the COVID19 vaccines. To help with that concern, WE ARE OFFERING THE COVID19 VACCINES IN OUR OFFICE! Ask the front desk for dates!     "Like" [place] on Facebook and Instagram for our latest updates!      A healthy democracy works best when [employer]  participate! Make sure you are registered to vote! If you have moved or changed any of your contact information, you will need to get this updated before voting!  In some cases, you MAY be able to register to vote online: [email or web address]

## 2021-04-02 NOTE — Progress Notes (Signed)
7  NEW PATIENT  Date of Service/Encounter:  04/02/21  Consult requested by: Laroy Apple M, DO   Assessment:   Acute urticaria  Possible food allergy - with negative testing  Plan/Recommendations:   1. Acute urticaria - Testing was negative to the shellfish and fin fish. - There is a the low positive predictive value of food allergy testing and hence the high possibility of false positives. - In contrast, food allergy testing has a high negative predictive value, therefore if testing is negative we can be relatively assured that they are indeed negative.  - Given the prolonged course and the long time between the emergence of the hives and the ingestion of the food, I think this is unlikely to be an allergy. - Try these foods again in the middle of the day with your kiddo's EpiPen handy.  - Come back if it happens again.  2. Return if symptoms worsen or fail to improve.    This note in its entirety was forwarded to the Provider who requested this consultation.  Subjective:   William Branch is a 35 y.o. male presenting today for evaluation of  Chief Complaint  Patient presents with  . Allergy Testing    William Branch has a history of the following: Patient Active Problem List   Diagnosis Date Noted  . 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 Testicular nodule 10/13/2020  . Testicular pain, left 10/11/2020    History obtained from: chart review and patient.  William Branch was referred by Annalee Genta, DO.     William Branch is a 35 y.o. male presenting for an evaluation of possible shellfish allergy.   Asthma/Respiratory Symptom History: He has an albuterol inhaler for breathing purposes at some point. Around one year ago or so, he was wheezing from bronchitis and received albuterol. He has not used it since. He denies nighttime coughing.   Allergic Rhinitis Symptom History: He takes an antihistamine as needed for his  allergies. Spring is the worst time of the year.   Food Allergy Symptom History: He had an episode a few nights around. His wife brought some mussel from [employer]. He did fine eating thme initially. But then the next morning, he waoke with hives over his arms and hands. he had swelling on his face including his ears and necks and nose. He did not have any breathing problems. He toughed it out on Sunday and Monday and then finally went to the doctor's office on Tuesday. He got three different drugs at the PCP's office. He had eaten that in the past without a problem. He did eat salmon since that time without a problem. He has avoided all shellfish since that time.   Otherwise, there is no history of other atopic diseases, including drug allergies, stinging insect allergies, eczema, urticaria or contact dermatitis. There is no significant infectious history. Vaccinations are up to date.    Past Medical History: Patient Active Problem List   Diagnosis Date Noted  . 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 Testicular nodule 10/13/2020  . Testicular pain, left 10/11/2020    Medication List:  Allergies as of 04/02/2021      Reactions   Shellfish Allergy Hives   Penicillin G    Penicillins    UNKNOWN REACTION      Medication List       Accurate as of April 02, 2021  4:43 PM. If you have  any questions, ask your nurse or doctor.        STOP taking these medications   predniSONE 20 MG tablet Commonly known as: DELTASONE Stopped by: William Spruce, MD     TAKE these medications   albuterol 108 (90 Base) MCG/ACT inhaler Commonly known as: VENTOLIN HFA Inhale 2 puffs into the lungs every 6 (six) hours as needed for wheezing.   EPINEPHrine 0.3 mg/0.3 mL Soaj injection Commonly known as: EPI-PEN Inject 0.3 mg into the muscle as needed for anaphylaxis.   famotidine 20 MG tablet Commonly known as: PEPCID Take 1 tablet (20 mg total) by mouth 2  (two) times daily.       Birth History: non-contributory  Developmental History: non-contributory  Past Surgical History: Past Surgical History:  Procedure Laterality Date  . TONSILLECTOMY       Family History: History reviewed. No pertinent family history.   Social History: Anay lives at home with his family.  He has had a house that is 34 years old.  There is hardwood in the living areas and carpeting in the bedrooms.  They have electric heating and central cooling.  There are dogs outside of the home.  There are no dust mite covers on the bedding.  There is no tobacco exposure.  He currently works for the [employer].  He works with [employer].  He has been there for 12 years.  He does not have a HEPA filter in the home.  He is not exposed to fumes, chemicals, or dust.  Review of Systems  Constitutional: Negative.  Negative for chills, fever, malaise/fatigue and weight loss.  HENT: Negative for congestion, ear discharge, ear pain and sinus pain.   Eyes: Negative for pain, discharge and redness.  Respiratory: Negative for cough, sputum production, shortness of breath and wheezing.   Cardiovascular: Negative.  Negative for chest pain and palpitations.  Gastrointestinal: Negative for abdominal pain, constipation, diarrhea, heartburn, nausea and vomiting.  Skin: Positive for itching and rash.  Neurological: Negative for dizziness and headaches.  Endo/Heme/Allergies: Negative for environmental allergies. Does not bruise/bleed easily.       Objective:   Blood pressure 108/66, pulse 71, temperature 97.9 F (36.6 C), temperature source Temporal, resp. rate 18, height 5\' 9"  (1.753 m), weight 216 lb (98 kg), SpO2 99 %. Body mass index is 31.9 kg/m.   Physical Exam:   Physical Exam Constitutional:      Appearance: He is well-developed.     Comments: Very pleasant male.   HENT:     Head: Normocephalic and atraumatic.     Right Ear: Tympanic membrane, ear canal and  external ear normal. No drainage, swelling or tenderness. Tympanic membrane is not injected, scarred, erythematous, retracted or bulging.     Left Ear: Tympanic membrane, ear canal and external ear normal. No drainage, swelling or tenderness. Tympanic membrane is not injected, scarred, erythematous, retracted or bulging.     Nose: No nasal deformity, septal deviation, mucosal edema or rhinorrhea.     Right Turbinates: Not enlarged or swollen.     Left Turbinates: Not enlarged or swollen.     Right Sinus: No maxillary sinus tenderness or frontal sinus tenderness.     Left Sinus: No maxillary sinus tenderness or frontal sinus tenderness.     Mouth/Throat:     Mouth: Mucous membranes are not pale and not dry.     Pharynx: Uvula midline.  Eyes:     General:  Right eye: No discharge.        Left eye: No discharge.     Conjunctiva/sclera: Conjunctivae normal.     Right eye: Right conjunctiva is not injected. No chemosis.    Left eye: Left conjunctiva is not injected. No chemosis.    Pupils: Pupils are equal, round, and reactive to light.  Cardiovascular:     Rate and Rhythm: Normal rate and regular rhythm.     Heart sounds: Normal heart sounds.  Pulmonary:     Effort: Pulmonary effort is normal. No tachypnea, accessory muscle usage or respiratory distress.     Breath sounds: Normal breath sounds. No wheezing, rhonchi or rales.  Chest:     Chest wall: No tenderness.  Abdominal:     Tenderness: There is no abdominal tenderness. There is no guarding or rebound.  Lymphadenopathy:     Head:     Right side of head: No submandibular, tonsillar or occipital adenopathy.     Left side of head: No submandibular, tonsillar or occipital adenopathy.     Cervical: No cervical adenopathy.  Skin:    Coloration: Skin is not pale.     Findings: No abrasion, erythema, petechiae or rash. Rash is not papular, urticarial or vesicular.  Neurological:     Mental Status: He is alert.  Psychiatric:         Behavior: Behavior is cooperative.      Diagnostic studies:     Allergy Studies:           Malachi Bonds, MD [facility]

## 2021-04-03 ENCOUNTER — Encounter: Payer: Self-pay | Admitting: Allergy & Immunology

## 2021-04-03 MED ORDER — EPINEPHRINE 0.3 MG/0.3ML IJ SOAJ: 0.3000 mg | Freq: Once | INTRAMUSCULAR | 1 refills | Status: AC

## 2022-05-18 IMAGING — US US SCROTUM W/ DOPPLER COMPLETE
1 series · 14 of 25 positions shown · non-contrast
Comparison: None.

CLINICAL DATA: Pain

EXAM:
SCROTAL ULTRASOUND
DOPPLER ULTRASOUND OF THE TESTICLES
TECHNIQUE: Complete ultrasound examination of the testicles, epididymis, and
other scrotal structures was performed. Color and spectral Doppler
ultrasound were also utilized to evaluate blood flow to the
testicles.

[Series 1: us scrotum w/ doppler complete · 0.06mm/px · 14 of 153 slices shown]
[im 1/153]
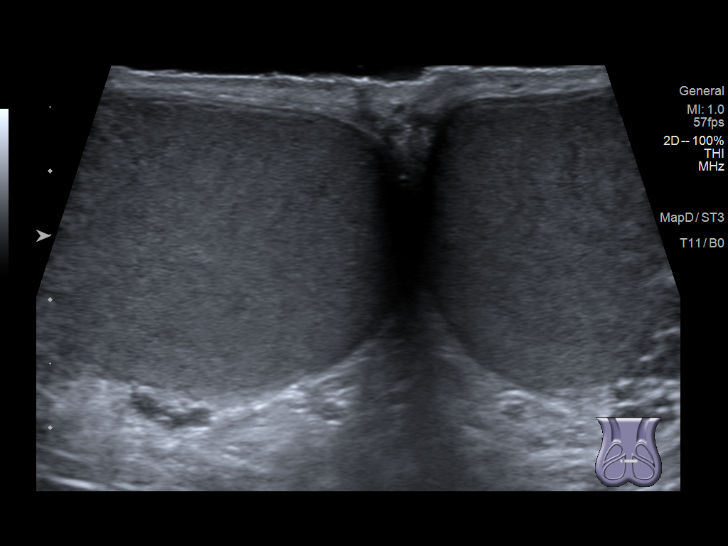
[im 13/153]
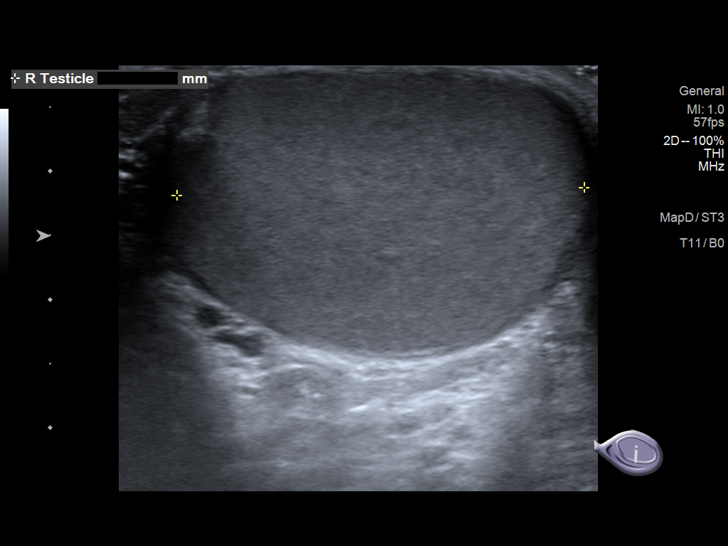
[im 26/153]
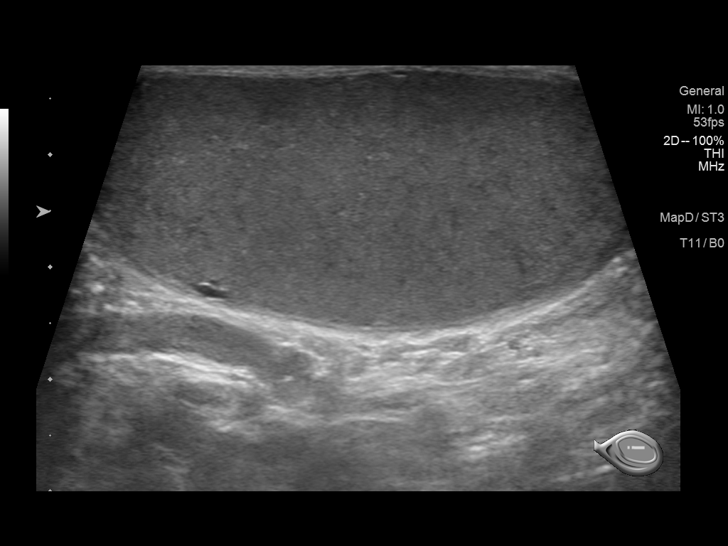
[im 39/153]
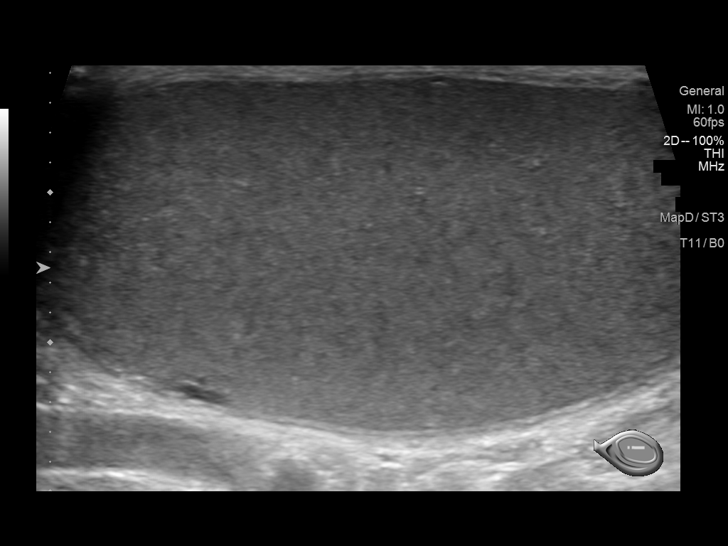
[im 51/153]
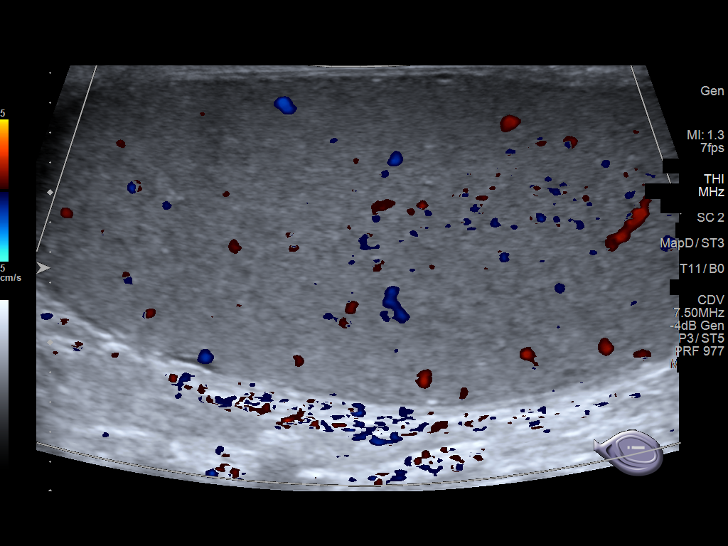
[im 58/153]
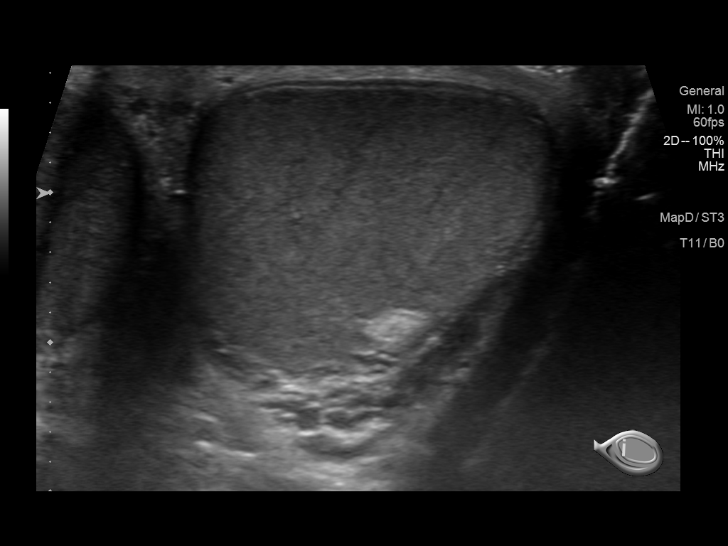
[im 70/153]
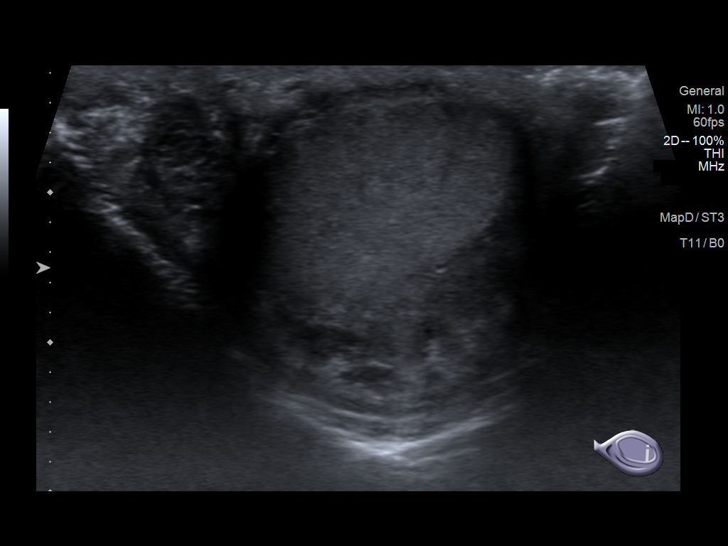
[im 83/153]
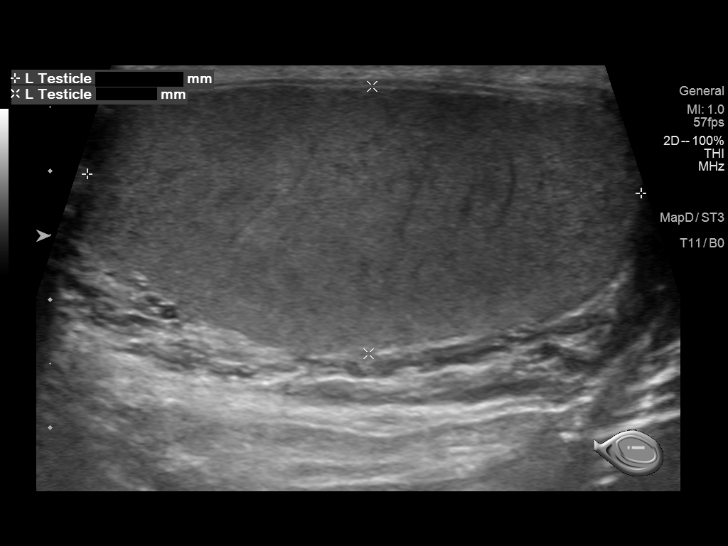
[im 96/153]
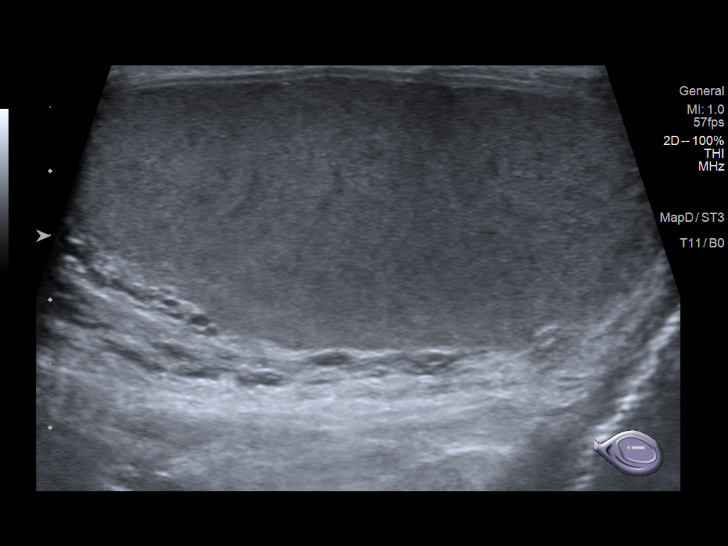
[im 102/153]
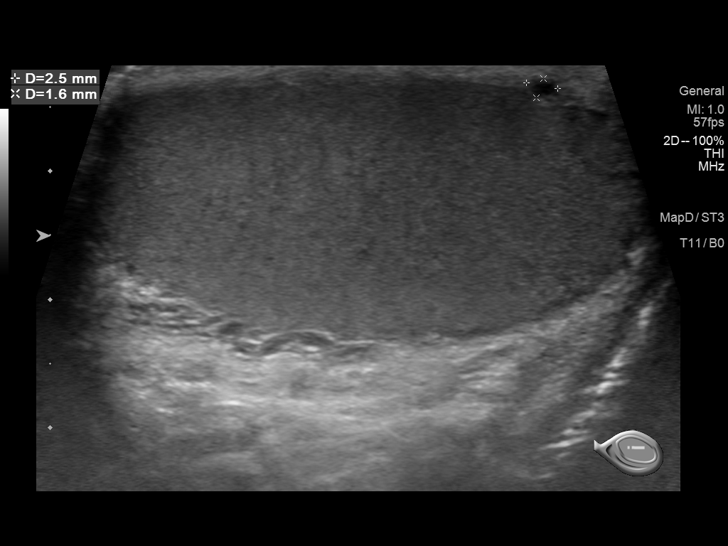
[im 115/153]
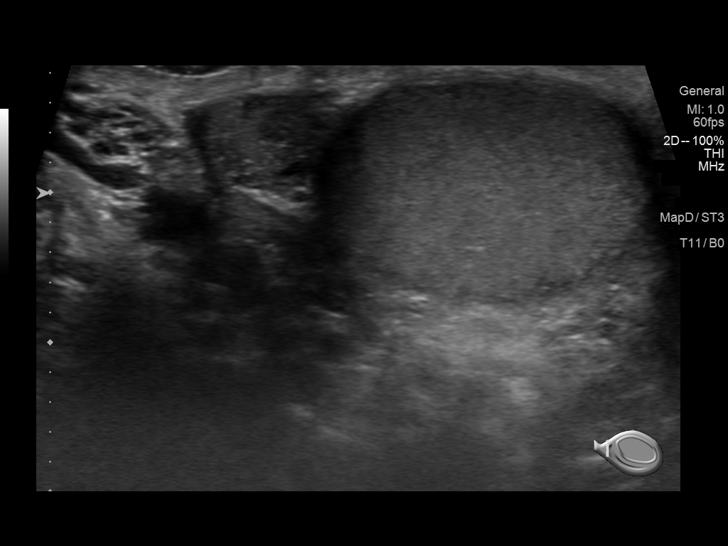
[im 127/153]
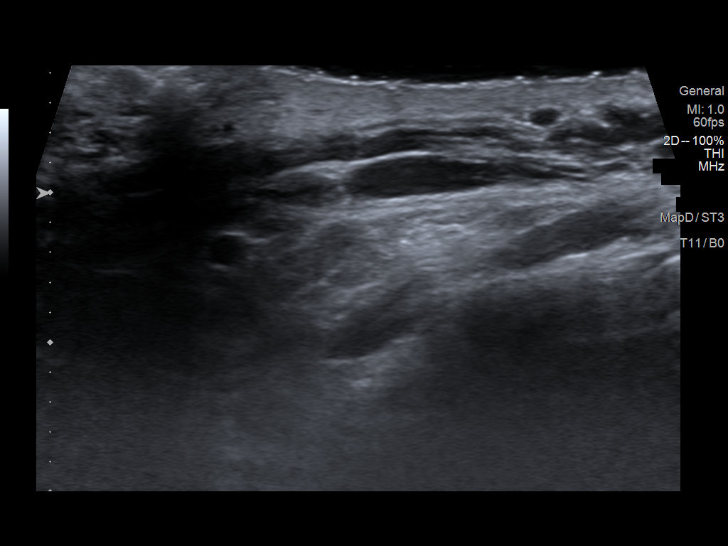
[im 140/153]
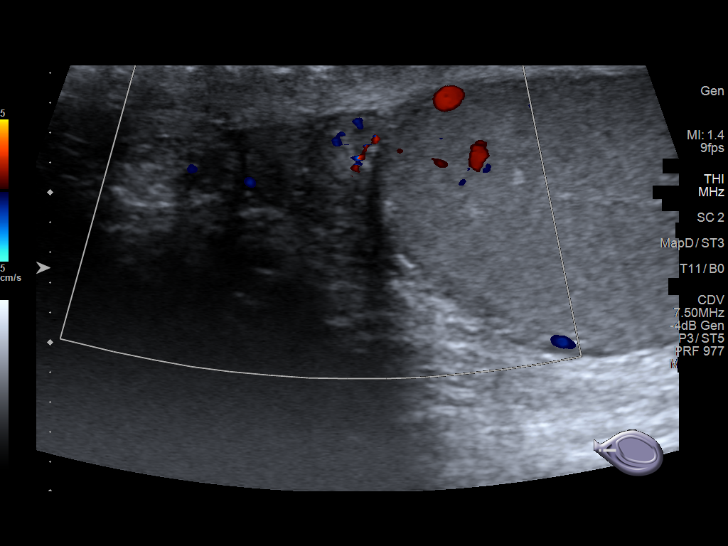
[im 153/153]
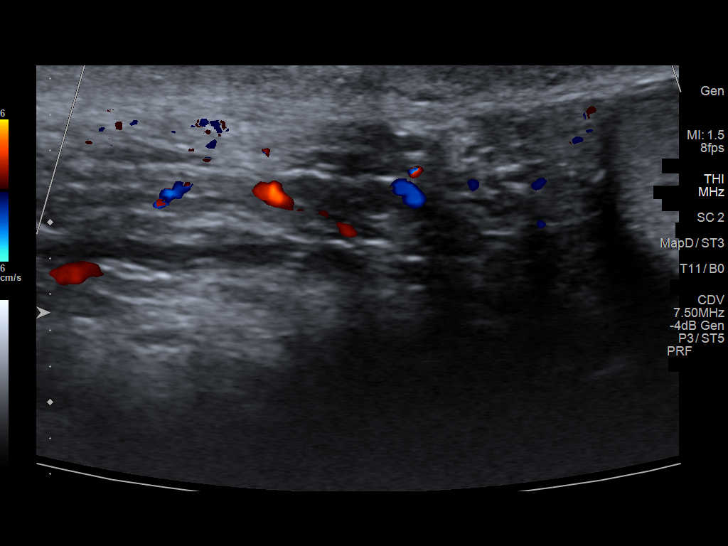

[14 of 25 positions shown; findings below may reference images not displayed]

FINDINGS: Right testicle

Measurements: 4.9 x 2.3 x 3.2 cm. No mass or microlithiasis
visualized.

Left testicle

Measurements: 4.3 x 2.1 x 2.6 cm. There is no intra testicular mass.
There is a small, likely cystic structure measuring 3 x 2 x 5 mm at
the inferior margin of the left testicle.

Right epididymis:  Normal in size and appearance.

Left epididymis:  Normal in size and appearance.

Hydrocele:  None visualized.

Varicocele:  None visualized.

Pulsed Doppler interrogation of both testes demonstrates normal low
resistance arterial and venous waveforms bilaterally.
IMPRESSION: 1. No acute abnormality.  No testicular mass.
2. Small, hypoechoic nodule adjacent to the left testicle is favored
to represent a tunica albuginea cyst.

## 2023-03-10 DIAGNOSIS — E669 Obesity, unspecified: Secondary | ICD-10-CM | POA: Diagnosis not present

## 2023-03-10 DIAGNOSIS — M25572 Pain in left ankle and joints of left foot: Secondary | ICD-10-CM | POA: Diagnosis not present

## 2023-03-10 DIAGNOSIS — Z6832 Body mass index (BMI) 32.0-32.9, adult: Secondary | ICD-10-CM | POA: Diagnosis not present

## 2023-06-14 ENCOUNTER — Emergency Department (HOSPITAL_COMMUNITY)
Admission: EM | Admit: 2023-06-14 | Discharge: 2023-06-15 | Disposition: A | Discharge status: Home / Self Care | Payer: Federal, State, Local not specified - PPO | Attending: Emergency Medicine | Admitting: Emergency Medicine

## 2023-06-14 ENCOUNTER — Other Ambulatory Visit: Payer: Self-pay

## 2023-06-14 ENCOUNTER — Encounter (HOSPITAL_COMMUNITY): Payer: Self-pay | Admitting: Emergency Medicine

## 2023-06-14 DIAGNOSIS — R110 Nausea: Secondary | ICD-10-CM | POA: Diagnosis not present

## 2023-06-14 DIAGNOSIS — K5730 Diverticulosis of large intestine without perforation or abscess without bleeding: Secondary | ICD-10-CM | POA: Diagnosis not present

## 2023-06-14 DIAGNOSIS — R197 Diarrhea, unspecified: Secondary | ICD-10-CM | POA: Diagnosis not present

## 2023-06-14 DIAGNOSIS — R112 Nausea with vomiting, unspecified: Secondary | ICD-10-CM | POA: Diagnosis not present

## 2023-06-14 DIAGNOSIS — R109 Unspecified abdominal pain: Secondary | ICD-10-CM

## 2023-06-14 DIAGNOSIS — R1011 Right upper quadrant pain: Secondary | ICD-10-CM | POA: Diagnosis not present

## 2023-06-14 DIAGNOSIS — I10 Essential (primary) hypertension: Secondary | ICD-10-CM | POA: Diagnosis not present

## 2023-06-14 DIAGNOSIS — M549 Dorsalgia, unspecified: Secondary | ICD-10-CM | POA: Diagnosis not present

## 2023-06-14 DIAGNOSIS — R0902 Hypoxemia: Secondary | ICD-10-CM | POA: Diagnosis not present

## 2023-06-14 LAB — COMPREHENSIVE METABOLIC PANEL
ALT: 25 U/L (ref 0–44)
AST: 27 U/L (ref 15–41)
Albumin: 4.4 g/dL (ref 3.5–5.0)
Alkaline Phosphatase: 49 U/L (ref 38–126)
Anion gap: 10 (ref 5–15)
BUN: 13 mg/dL (ref 6–20)
CO2: 25 mmol/L (ref 22–32)
Calcium: 9.3 mg/dL (ref 8.9–10.3)
Chloride: 104 mmol/L (ref 98–111)
Creatinine, Ser: 1.21 mg/dL (ref 0.61–1.24)
GFR, Estimated: >60 mL/min (ref >60)
Glucose, Bld: 105 mg/dL — ABNORMAL HIGH (ref 70–99)
Potassium: 3.5 mmol/L (ref 3.5–5.1)
Sodium: 139 mmol/L (ref 135–145)
Total Bilirubin: 1.6 mg/dL — ABNORMAL HIGH (ref 0.3–1.2)
Total Protein: 7.5 g/dL (ref 6.5–8.1)

## 2023-06-14 LAB — CBC
HCT: 34.8 % — ABNORMAL LOW (ref 39.0–52.0)
Hemoglobin: 12.3 g/dL — ABNORMAL LOW (ref 13.0–17.0)
MCH: 29.1 pg (ref 26.0–34.0)
MCHC: 35.3 g/dL (ref 30.0–36.0)
MCV: 82.5 fL (ref 80.0–100.0)
RBC: 4.22 MIL/uL (ref 4.22–5.81)
RDW: 12.7 % (ref 11.5–15.5)
WBC: 10.7 10*3/uL — ABNORMAL HIGH (ref 4.0–10.5)
nRBC: 0 % (ref 0.0–0.2)

## 2023-06-14 LAB — LIPASE, BLOOD: Lipase: 28 U/L (ref 11–51)

## 2023-06-14 MED ORDER — ONDANSETRON HCL 4 MG/2ML IJ SOLN: 4.0000 mg | Freq: Once | INTRAMUSCULAR | Status: DC | PRN

## 2023-06-14 NOTE — ED Triage Notes (Signed)
Pt via [facility] from home c/o thoracic back pain radiating to umbilical area. No fever, no recent illness. EMS admin 6mg  morphine and 12.5mg  phenergan en route via 18ga in left AC. Received NS PTA. Vitals WNL, CBG 124.

## 2023-06-14 NOTE — ED Notes (Signed)
Re-eval pt; denies pain and states nausea is resolved at this time.

## 2023-06-15 ENCOUNTER — Emergency Department (HOSPITAL_COMMUNITY): Payer: Federal, State, Local not specified - PPO

## 2023-06-15 DIAGNOSIS — K5730 Diverticulosis of large intestine without perforation or abscess without bleeding: Secondary | ICD-10-CM | POA: Diagnosis not present

## 2023-06-15 DIAGNOSIS — R109 Unspecified abdominal pain: Secondary | ICD-10-CM | POA: Diagnosis not present

## 2023-06-15 LAB — URINALYSIS, ROUTINE W REFLEX MICROSCOPIC
Bacteria, UA: NONE SEEN
Bilirubin Urine: NEGATIVE
Glucose, UA: NEGATIVE mg/dL
Ketones, ur: 20 mg/dL — AB
Leukocytes,Ua: NEGATIVE
Nitrite: NEGATIVE
Protein, ur: NEGATIVE mg/dL
Specific Gravity, Urine: 1.012 (ref 1.005–1.030)
pH: 6 (ref 5.0–8.0)

## 2023-06-15 MED ORDER — SODIUM CHLORIDE 0.9 % IV BOLUS
1000.0000 mL | Freq: Once | INTRAVENOUS | Status: AC
  Administered 2023-06-15: 1000 mL via INTRAVENOUS

## 2023-06-15 NOTE — ED Provider Notes (Signed)
[place] EMERGENCY DEPARTMENT AT [facility]  Provider Note  CSN: [id number] Arrival date & time: 06/14/23 1749  History Chief Complaint  Patient presents with   Abdominal Pain   Back Pain    JOBANI ARABIE is a 37 y.o. male with no significant PMH reports he was at work around 1400hrs when he had sudden onset of R flank pain radiating around to his RUQ, associated with nausea and a few episodes of non bloody diarrhea. He initially went home but when symptoms did not improved he called EMS. He was given pain and nausea medications enroute and has been symptom free for several hours while awaiting ED bed. He reports some urinary hesitancy earlier in the day but no dysuria or hematuria. No fevers. No prior history of similar.    Home Medications Prior to Admission medications   Medication Sig Start Date End Date Taking? Authorizing Provider  albuterol (PROVENTIL HFA;VENTOLIN HFA) 108 (90 BASE) MCG/ACT inhaler Inhale 2 puffs into the lungs every 6 (six) hours as needed for wheezing. 03/15/13   Babs Sciara, MD  EPINEPHrine 0.3 mg/0.3 mL IJ SOAJ injection Inject 0.3 mg into the muscle as needed for anaphylaxis. 02/25/21   Novella Olive, FNP  famotidine (PEPCID) 20 MG tablet Take 1 tablet (20 mg total) by mouth 2 (two) times daily. 02/25/21   Novella Olive, FNP     Allergies    Shellfish allergy, Penicillin g, and Penicillins   Review of Systems   Review of Systems Please see HPI for pertinent positives and negatives  Physical Exam BP 101/70   Pulse 60   Temp 97.8 F (36.6 C)   Resp 16   Ht 5\' 9"  (1.753 m)   Wt 95.3 kg   SpO2 98%   BMI 31.01 kg/m   Physical Exam Vitals and nursing note reviewed.  Constitutional:      Appearance: Normal appearance.  HENT:     Head: Normocephalic and atraumatic.     Nose: Nose normal.     Mouth/Throat:     Mouth: Mucous membranes are moist.  Eyes:     Extraocular Movements: Extraocular movements intact.      Conjunctiva/sclera: Conjunctivae normal.  Cardiovascular:     Rate and Rhythm: Normal rate.  Pulmonary:     Effort: Pulmonary effort is normal.     Breath sounds: Normal breath sounds.  Abdominal:     General: Abdomen is flat.     Palpations: Abdomen is soft.     Tenderness: There is no abdominal tenderness. There is no guarding. Negative signs include Murphy's sign and McBurney's sign.  Musculoskeletal:        General: No swelling. Normal range of motion.     Cervical back: Neck supple.  Skin:    General: Skin is warm and dry.  Neurological:     General: No focal deficit present.     Mental Status: He is alert.  Psychiatric:        Mood and Affect: Mood normal.     ED Results / Procedures / Treatments   EKG None  Procedures Procedures  Medications Ordered in the ED Medications  ondansetron (ZOFRAN) injection 4 mg (has no administration in time range)  sodium chloride 0.9 % bolus 1,000 mL (0 mLs Intravenous Stopped 06/15/23 0212)    Initial Impression and Plan  Patient here after an episode of R flank and RUQ pain earlier in the day. Consider renal colic or biliary colic.  Less likely viral gastroenteritis. Labs done in triage show unremarkable CBC, CMP and lipase. Patient has not yet had a urine collected but reports he has been urinating since arrival. He is symptom free at the time of my evaluation although BP is borderline low. Will give IVF, check CT for renal stones and collect urine when able.   ED Course   Clinical Course as of 06/15/23 0437  Tue Jun 15, 2023  0259 I personally viewed the images from radiology studies and agree with radiologist interpretation:  CT neg for stone or other acute process. UA is pending.  [CS]  0327 UA has some blood but no infection. Suspect he passed a stone earlier today. Advised that is his pain returns he should come back for re-evaluation. Otherwise he has been asymptomatic for several hours now and does not require any additional  ED investigation [CS]    Clinical Course User Index [CS] Pollyann Savoy, MD     MDM Rules/Calculators/A&P Medical Decision Making Problems Addressed: Flank pain: acute illness or injury  Amount and/or Complexity of Data Reviewed Labs: ordered. Decision-making details documented in ED Course. Radiology: ordered and independent interpretation performed. Decision-making details documented in ED Course.  Risk Prescription drug management.     Final Clinical Impression(s) / ED Diagnoses Final diagnoses:  Flank pain    Rx / DC Orders ED Discharge Orders     None        Pollyann Savoy, MD 06/15/23 [phone]

## 2023-06-15 NOTE — ED Notes (Signed)
ED Provider at bedside. 

## 2023-06-15 NOTE — ED Notes (Signed)
Pt aware that we need a urine sample. Pt given a urinal at bedside and will alert staff when has a urine sample

## 2023-06-15 NOTE — ED Notes (Signed)
Pt ambulated to the bathroom unassisted. Urine sample provided
